# Patient Record
Sex: Female | Born: 1950 | Race: White | Hispanic: No | Smoking: Never smoker
Health system: Southern US, Community
[De-identification: ages and names within clinical notes are randomized; demographics above are authoritative.]

## PROBLEM LIST (undated history)

## (undated) DIAGNOSIS — Z881 Allergy status to other antibiotic agents status: Secondary | ICD-10-CM

## (undated) DIAGNOSIS — Z889 Allergy status to unspecified drugs, medicaments and biological substances status: Secondary | ICD-10-CM

## (undated) DIAGNOSIS — S15009A Unspecified injury of unspecified carotid artery, initial encounter: Secondary | ICD-10-CM

## (undated) DIAGNOSIS — S0990XA Unspecified injury of head, initial encounter: Secondary | ICD-10-CM

## (undated) DIAGNOSIS — E119 Type 2 diabetes mellitus without complications: Secondary | ICD-10-CM

## (undated) DIAGNOSIS — C801 Malignant (primary) neoplasm, unspecified: Secondary | ICD-10-CM

## (undated) DIAGNOSIS — T07XXXA Unspecified multiple injuries, initial encounter: Secondary | ICD-10-CM

## (undated) DIAGNOSIS — E785 Hyperlipidemia, unspecified: Secondary | ICD-10-CM

## (undated) DIAGNOSIS — Z8672 Personal history of thrombophlebitis: Secondary | ICD-10-CM

## (undated) DIAGNOSIS — G40409 Other generalized epilepsy and epileptic syndromes, not intractable, without status epilepticus: Secondary | ICD-10-CM

## (undated) DIAGNOSIS — F32A Depression, unspecified: Secondary | ICD-10-CM

## (undated) DIAGNOSIS — F329 Major depressive disorder, single episode, unspecified: Secondary | ICD-10-CM

## (undated) DIAGNOSIS — R4182 Altered mental status, unspecified: Secondary | ICD-10-CM

## (undated) DIAGNOSIS — E039 Hypothyroidism, unspecified: Secondary | ICD-10-CM

## (undated) DIAGNOSIS — L309 Dermatitis, unspecified: Secondary | ICD-10-CM

## (undated) DIAGNOSIS — E28319 Asymptomatic premature menopause: Secondary | ICD-10-CM

## (undated) DIAGNOSIS — F419 Anxiety disorder, unspecified: Secondary | ICD-10-CM

## (undated) HISTORY — DX: Unspecified multiple injuries, initial encounter: T07.XXXA

## (undated) HISTORY — DX: Unspecified injury of unspecified carotid artery, initial encounter: S15.009A

## (undated) HISTORY — DX: Allergy status to unspecified drugs, medicaments and biological substances: Z88.9

## (undated) HISTORY — DX: Asymptomatic premature menopause: E28.319

## (undated) HISTORY — DX: Unspecified injury of head, initial encounter: S09.90XA

## (undated) HISTORY — DX: Anxiety disorder, unspecified: F41.9

## (undated) HISTORY — DX: Depression, unspecified: F32.A

## (undated) HISTORY — DX: Other generalized epilepsy and epileptic syndromes, not intractable, without status epilepticus: G40.409

## (undated) HISTORY — DX: Dermatitis, unspecified: L30.9

## (undated) HISTORY — DX: Hyperlipidemia, unspecified: E78.5

## (undated) HISTORY — DX: Type 2 diabetes mellitus without complications: E11.9

## (undated) HISTORY — DX: Personal history of thrombophlebitis: Z86.72

## (undated) HISTORY — DX: Allergy status to other antibiotic agents: Z88.1

## (undated) HISTORY — DX: Altered mental status, unspecified: R41.82

## (undated) HISTORY — DX: Malignant (primary) neoplasm, unspecified: C80.1

## (undated) HISTORY — DX: Major depressive disorder, single episode, unspecified: F32.9

## (undated) HISTORY — DX: Hypothyroidism, unspecified: E03.9

---

## 1992-04-12 HISTORY — PX: BARTHOLIN GLAND CYST EXCISION: SHX565

## 1998-03-26 ENCOUNTER — Other Ambulatory Visit: Admission: RE | Admit: 1998-03-26 | Discharge: 1998-03-26 | Payer: Self-pay | Admitting: Gynecology

## 1999-08-05 ENCOUNTER — Other Ambulatory Visit: Admission: RE | Admit: 1999-08-05 | Discharge: 1999-08-05 | Payer: Self-pay | Admitting: Gynecology

## 1999-08-07 ENCOUNTER — Encounter: Admission: RE | Admit: 1999-08-07 | Discharge: 1999-08-07 | Payer: Self-pay | Admitting: Gynecology

## 1999-08-07 ENCOUNTER — Encounter: Payer: Self-pay | Admitting: Gynecology

## 1999-12-18 ENCOUNTER — Other Ambulatory Visit: Admission: RE | Admit: 1999-12-18 | Discharge: 1999-12-18 | Payer: Self-pay | Admitting: Gynecology

## 2000-01-07 ENCOUNTER — Other Ambulatory Visit: Admission: RE | Admit: 2000-01-07 | Discharge: 2000-01-07 | Payer: Self-pay | Admitting: Gynecology

## 2000-07-07 ENCOUNTER — Encounter: Payer: Self-pay | Admitting: Gynecology

## 2000-07-07 ENCOUNTER — Encounter: Admission: RE | Admit: 2000-07-07 | Discharge: 2000-07-07 | Payer: Self-pay | Admitting: Gynecology

## 2000-10-27 ENCOUNTER — Other Ambulatory Visit: Admission: RE | Admit: 2000-10-27 | Discharge: 2000-10-27 | Payer: Self-pay | Admitting: Gynecology

## 2001-04-09 ENCOUNTER — Emergency Department (HOSPITAL_COMMUNITY): Admission: EM | Admit: 2001-04-09 | Discharge: 2001-04-09 | Payer: Self-pay | Admitting: Emergency Medicine

## 2001-04-09 ENCOUNTER — Encounter: Payer: Self-pay | Admitting: Emergency Medicine

## 2001-11-09 ENCOUNTER — Other Ambulatory Visit: Admission: RE | Admit: 2001-11-09 | Discharge: 2001-11-09 | Payer: Self-pay | Admitting: Gynecology

## 2001-11-28 ENCOUNTER — Encounter: Payer: Self-pay | Admitting: Gynecology

## 2001-11-28 ENCOUNTER — Encounter: Admission: RE | Admit: 2001-11-28 | Discharge: 2001-11-28 | Payer: Self-pay | Admitting: Gynecology

## 2003-03-20 ENCOUNTER — Other Ambulatory Visit: Admission: RE | Admit: 2003-03-20 | Discharge: 2003-03-20 | Payer: Self-pay | Admitting: Gynecology

## 2003-04-17 ENCOUNTER — Ambulatory Visit (HOSPITAL_COMMUNITY): Admission: RE | Admit: 2003-04-17 | Discharge: 2003-04-17 | Payer: Self-pay | Admitting: Gynecology

## 2005-04-14 ENCOUNTER — Other Ambulatory Visit: Admission: RE | Admit: 2005-04-14 | Discharge: 2005-04-14 | Payer: Self-pay | Admitting: Gynecology

## 2005-04-22 ENCOUNTER — Ambulatory Visit (HOSPITAL_COMMUNITY): Admission: RE | Admit: 2005-04-22 | Discharge: 2005-04-22 | Payer: Self-pay | Admitting: Family Medicine

## 2005-04-23 ENCOUNTER — Encounter: Admission: RE | Admit: 2005-04-23 | Discharge: 2005-04-23 | Payer: Self-pay | Admitting: Gynecology

## 2005-07-27 ENCOUNTER — Encounter: Admission: RE | Admit: 2005-07-27 | Discharge: 2005-07-27 | Payer: Self-pay | Admitting: General Surgery

## 2005-12-15 ENCOUNTER — Ambulatory Visit (HOSPITAL_COMMUNITY): Admission: RE | Admit: 2005-12-15 | Discharge: 2005-12-15 | Payer: Self-pay | Admitting: Family Medicine

## 2006-05-18 ENCOUNTER — Encounter: Admission: RE | Admit: 2006-05-18 | Discharge: 2006-05-18 | Payer: Self-pay | Admitting: General Surgery

## 2006-05-23 ENCOUNTER — Other Ambulatory Visit: Admission: RE | Admit: 2006-05-23 | Discharge: 2006-05-23 | Payer: Self-pay | Admitting: Gynecology

## 2010-09-18 ENCOUNTER — Encounter: Payer: Self-pay | Admitting: Nurse Practitioner

## 2010-11-18 LAB — TSH: TSH: 0.24 u[IU]/mL — AB (ref <=5.90)

## 2011-02-16 ENCOUNTER — Other Ambulatory Visit (HOSPITAL_COMMUNITY): Payer: Self-pay | Admitting: Family Medicine

## 2011-02-16 DIAGNOSIS — Z139 Encounter for screening, unspecified: Secondary | ICD-10-CM

## 2011-02-23 ENCOUNTER — Ambulatory Visit (HOSPITAL_COMMUNITY)
Admission: RE | Admit: 2011-02-23 | Discharge: 2011-02-23 | Disposition: A | Discharge status: Home / Self Care | Payer: PRIVATE HEALTH INSURANCE | Source: Ambulatory Visit | Attending: Family Medicine | Admitting: Family Medicine

## 2011-02-23 DIAGNOSIS — Z139 Encounter for screening, unspecified: Secondary | ICD-10-CM

## 2011-02-23 DIAGNOSIS — Z1231 Encounter for screening mammogram for malignant neoplasm of breast: Secondary | ICD-10-CM | POA: Insufficient documentation

## 2012-07-07 ENCOUNTER — Ambulatory Visit: Payer: Self-pay | Admitting: Nurse Practitioner

## 2012-07-31 ENCOUNTER — Telehealth: Payer: Self-pay | Admitting: Nurse Practitioner

## 2012-08-01 NOTE — Telephone Encounter (Signed)
PT WILL CALL BACK FOR APPT- COULDN'T DECIDE WHEN SHE WANTS TO COME.

## 2012-08-28 ENCOUNTER — Other Ambulatory Visit: Payer: Self-pay | Admitting: Obstetrics and Gynecology

## 2012-08-28 DIAGNOSIS — Z1231 Encounter for screening mammogram for malignant neoplasm of breast: Secondary | ICD-10-CM

## 2012-08-29 ENCOUNTER — Ambulatory Visit (HOSPITAL_COMMUNITY): Payer: Self-pay | Attending: Obstetrics and Gynecology

## 2012-08-29 ENCOUNTER — Ambulatory Visit (HOSPITAL_COMMUNITY): Payer: PRIVATE HEALTH INSURANCE

## 2012-11-07 ENCOUNTER — Telehealth: Payer: Self-pay | Admitting: Nurse Practitioner

## 2012-11-07 NOTE — Telephone Encounter (Signed)
appt changed

## 2012-11-09 ENCOUNTER — Other Ambulatory Visit (HOSPITAL_COMMUNITY): Payer: Self-pay | Admitting: Nurse Practitioner

## 2012-11-09 DIAGNOSIS — Z139 Encounter for screening, unspecified: Secondary | ICD-10-CM

## 2012-11-13 ENCOUNTER — Ambulatory Visit (HOSPITAL_COMMUNITY)
Admission: RE | Admit: 2012-11-13 | Discharge: 2012-11-13 | Disposition: A | Discharge status: Home / Self Care | Payer: Self-pay | Source: Ambulatory Visit | Attending: Nurse Practitioner | Admitting: Nurse Practitioner

## 2012-11-13 ENCOUNTER — Ambulatory Visit (HOSPITAL_COMMUNITY): Payer: PRIVATE HEALTH INSURANCE

## 2012-11-13 DIAGNOSIS — Z1231 Encounter for screening mammogram for malignant neoplasm of breast: Secondary | ICD-10-CM | POA: Insufficient documentation

## 2012-11-13 DIAGNOSIS — Z139 Encounter for screening, unspecified: Secondary | ICD-10-CM

## 2012-11-29 ENCOUNTER — Encounter: Payer: Self-pay | Admitting: *Deleted

## 2012-12-08 ENCOUNTER — Ambulatory Visit (INDEPENDENT_AMBULATORY_CARE_PROVIDER_SITE_OTHER): Payer: Self-pay | Admitting: Nurse Practitioner

## 2012-12-08 ENCOUNTER — Ambulatory Visit: Payer: Self-pay | Admitting: General Practice

## 2012-12-08 ENCOUNTER — Ambulatory Visit: Payer: Self-pay | Admitting: Nurse Practitioner

## 2012-12-08 ENCOUNTER — Encounter: Payer: Self-pay | Admitting: Nurse Practitioner

## 2012-12-08 VITALS — BP 128/84 | HR 108 | Temp 97.5°F | Ht 66.0 in | Wt 161.0 lb

## 2012-12-08 DIAGNOSIS — R609 Edema, unspecified: Secondary | ICD-10-CM

## 2012-12-08 DIAGNOSIS — R569 Unspecified convulsions: Secondary | ICD-10-CM | POA: Insufficient documentation

## 2012-12-08 DIAGNOSIS — F411 Generalized anxiety disorder: Secondary | ICD-10-CM | POA: Insufficient documentation

## 2012-12-08 DIAGNOSIS — E039 Hypothyroidism, unspecified: Secondary | ICD-10-CM

## 2012-12-08 MED ORDER — LEVOTHYROXINE SODIUM 50 MCG PO TABS: 50.0000 ug | ORAL_TABLET | Freq: Every day | ORAL | Status: DC

## 2012-12-08 MED ORDER — FUROSEMIDE 20 MG PO TABS: 20.0000 mg | ORAL_TABLET | Freq: Every day | ORAL | Status: DC

## 2012-12-08 MED ORDER — ALPRAZOLAM 2 MG PO TABS: 2.0000 mg | ORAL_TABLET | Freq: Every evening | ORAL | Status: DC | PRN

## 2012-12-08 NOTE — Patient Instructions (Addendum)
Edema  Edema is an abnormal build-up of fluids in tissues. Because this is partly dependent on gravity (water flows to the lowest place), it is more common in the legs and thighs (lower extremities). It is also common in the looser tissues, like around the eyes. Painless swelling of the feet and ankles is common and increases as a person ages. It may affect both legs and may include the calves or even thighs. When squeezed, the fluid may move out of the affected area and may leave a dent for a few moments.  CAUSES    Prolonged standing or sitting in one place for extended periods of time. Movement helps pump tissue fluid into the veins, and absence of movement prevents this, resulting in edema.   Varicose veins. The valves in the veins do not work as well as they should. This causes fluid to leak into the tissues.   Fluid and salt overload.   Injury, burn, or surgery to the leg, ankle, or foot, may damage veins and allow fluid to leak out.   Sunburn damages vessels. Leaky vessels allow fluid to go out into the sunburned tissues.   Allergies (from insect bites or stings, medications or chemicals) cause swelling by allowing vessels to become leaky.   Protein in the blood helps keep fluid in your vessels. Low protein, as in malnutrition, allows fluid to leak out.   Hormonal changes, including pregnancy and menstruation, cause fluid retention. This fluid may leak out of vessels and cause edema.   Medications that cause fluid retention. Examples are sex hormones, blood pressure medications, steroid treatment, or anti-depressants.   Some illnesses cause edema, especially heart failure, kidney disease, or liver disease.   Surgery that cuts veins or lymph nodes, such as surgery done for the heart or for breast cancer, may result in edema.  DIAGNOSIS   Your caregiver is usually easily able to determine what is causing your swelling (edema) by simply asking what is wrong (getting a history) and examining you (doing  a physical). Sometimes x-rays, EKG (electrocardiogram or heart tracing), and blood work may be done to evaluate for underlying medical illness.  TREATMENT   General treatment includes:   Leg elevation (or elevation of the affected body part).   Restriction of fluid intake.   Prevention of fluid overload.   Compression of the affected body part. Compression with elastic bandages or support stockings squeezes the tissues, preventing fluid from entering and forcing it back into the blood vessels.   Diuretics (also called water pills or fluid pills) pull fluid out of your body in the form of increased urination. These are effective in reducing the swelling, but can have side effects and must be used only under your caregiver's supervision. Diuretics are appropriate only for some types of edema.  The specific treatment can be directed at any underlying causes discovered. Heart, liver, or kidney disease should be treated appropriately.  HOME CARE INSTRUCTIONS    Elevate the legs (or affected body part) above the level of the heart, while lying down.   Avoid sitting or standing still for prolonged periods of time.   Avoid putting anything directly under the knees when lying down, and do not wear constricting clothing or garters on the upper legs.   Exercising the legs causes the fluid to work back into the veins and lymphatic channels. This may help the swelling go down.   The pressure applied by elastic bandages or support stockings can help reduce ankle swelling.     A low-salt diet may help reduce fluid retention and decrease the ankle swelling.   Take any medications exactly as prescribed.  SEEK MEDICAL CARE IF:   Your edema is not responding to recommended treatments.  SEEK IMMEDIATE MEDICAL CARE IF:    You develop shortness of breath or chest pain.   You cannot breathe when you lay down; or if, while lying down, you have to get up and go to the window to get your breath.   You are having increasing  swelling without relief from treatment.   You develop a fever over 102 F (38.9 C).   You develop pain or redness in the areas that are swollen.   Tell your caregiver right away if you have gained 3 lb/1.4 kg in 1 day or 5 lb/2.3 kg in a week.  MAKE SURE YOU:    Understand these instructions.   Will watch your condition.   Will get help right away if you are not doing well or get worse.  Document Released: 03/29/2005 Document Revised: 09/28/2011 Document Reviewed: 11/15/2007  Exit[employer] Patient Information 2014 [employer].

## 2012-12-08 NOTE — Addendum Note (Signed)
Addended by: Bennie Pierini on: 12/08/2012 05:01 PM   Modules accepted: Orders

## 2012-12-08 NOTE — Progress Notes (Signed)
  Subjective:    Patient ID: Heather Leblanc, female    DOB: January 29, 1951, 62 y.o.   MRN: [id number]  Thyroid Problem Presents for follow-up (hypothyroidism) visit. Patient reports no anxiety, constipation, depressed mood, diaphoresis, diarrhea, dry skin, heat intolerance, hoarse voice, menstrual problem, palpitations, tremors or visual change.   GAD Xanax 2mg  BID- can't make it without it- Dr. Elana Alm bumped her up to 2 mg several years ago.  Review of Systems  Constitutional: Positive for unexpected weight change (gained). Negative for diaphoresis.  HENT: Negative for hoarse voice.   Cardiovascular: Negative for palpitations.  Gastrointestinal: Negative for diarrhea and constipation.  Endocrine: Negative for heat intolerance.  Genitourinary: Negative for menstrual problem.  Neurological: Negative for tremors.       Objective:   Physical Exam  Constitutional: She is oriented to person, place, and time. She appears well-developed and well-nourished.  HENT:  Nose: Nose normal.  Mouth/Throat: Oropharynx is clear and moist.  Eyes: EOM are normal.  Neck: Trachea normal, normal range of motion and full passive range of motion without pain. Neck supple. No JVD present. Carotid bruit is not present. No thyromegaly present.  Cardiovascular: Normal rate, regular rhythm, normal heart sounds and intact distal pulses.  Exam reveals no gallop and no friction rub.   No murmur heard. Pulmonary/Chest: Effort normal and breath sounds normal.  Abdominal: Soft. Bowel sounds are normal. She exhibits no distension and no mass. There is no tenderness.  Musculoskeletal: Normal range of motion. She exhibits edema (2+ right lower ext and 1+ left lower ext).  Lymphadenopathy:    She has no cervical adenopathy.  Neurological: She is alert and oriented to person, place, and time. She has normal reflexes.  Skin: Skin is warm and dry.  Psychiatric: She has a normal mood and affect. Her behavior is normal. Judgment  and thought content normal.   BP 128/84  Pulse 108  Temp(Src) 97.5 F (36.4 C) (Oral)  Ht 5\' 6"  (1.676 m)  Wt 161 lb (73.029 kg)  BMI 26 kg/m2        Assessment & Plan:  1. Hypothyroidism Continue current meds  2. Seizures No seizure in years  3. GAD (generalized anxiety disorder) Stress management  4. Peripheral edema Elevate legs when sitting - furosemide (LASIX) 20 MG tablet; Take 1 tablet (20 mg total) by mouth daily.  Dispense: 30 tablet; Refill: 3  Patient said going [facility]  Shaka-Margaret Daphine Deutscher, FNP

## 2012-12-12 ENCOUNTER — Ambulatory Visit: Payer: Self-pay | Admitting: Nurse Practitioner

## 2013-02-06 ENCOUNTER — Telehealth: Payer: Self-pay | Admitting: Nurse Practitioner

## 2013-02-06 ENCOUNTER — Other Ambulatory Visit: Payer: Self-pay | Admitting: Nurse Practitioner

## 2013-02-08 NOTE — Telephone Encounter (Signed)
Last seen 12/08/12  DWM  If approved route to nurse to phone into [facility]

## 2013-02-08 NOTE — Telephone Encounter (Signed)
CALLED IN 

## 2013-02-08 NOTE — Telephone Encounter (Signed)
Please call in xanax rx 

## 2013-03-05 ENCOUNTER — Other Ambulatory Visit: Payer: Self-pay | Admitting: Nurse Practitioner

## 2013-03-06 ENCOUNTER — Other Ambulatory Visit: Payer: Self-pay

## 2013-03-06 NOTE — Telephone Encounter (Signed)
Please call in xanax

## 2013-03-06 NOTE — Telephone Encounter (Signed)
Last seen 12/08/12  MMM  If approved route to nurse to call into [facility] 

## 2013-03-07 NOTE — Telephone Encounter (Signed)
Called into [facility] 

## 2013-03-09 ENCOUNTER — Other Ambulatory Visit: Payer: Self-pay | Admitting: Nurse Practitioner

## 2013-04-02 ENCOUNTER — Other Ambulatory Visit: Payer: Self-pay | Admitting: Nurse Practitioner

## 2013-04-03 NOTE — Telephone Encounter (Signed)
rx called into pharmacy

## 2013-04-03 NOTE — Telephone Encounter (Signed)
Last seen 12/08/12  MMM  If approved route to nurse to call into [facility]

## 2013-04-03 NOTE — Telephone Encounter (Signed)
Please call in xanax

## 2013-04-24 ENCOUNTER — Other Ambulatory Visit: Payer: Self-pay | Admitting: Nurse Practitioner

## 2013-04-27 ENCOUNTER — Other Ambulatory Visit: Payer: Self-pay | Admitting: *Deleted

## 2013-04-27 MED ORDER — ALPRAZOLAM 2 MG PO TABS: ORAL_TABLET | ORAL | Status: DC

## 2013-04-27 NOTE — Telephone Encounter (Signed)
rx called into the pharmacy  

## 2013-04-27 NOTE — Telephone Encounter (Signed)
Please call in xanax rx with note to do not fill until 05/07/13

## 2013-04-27 NOTE — Telephone Encounter (Signed)
Patient last seen in office on 8-29. Rx last filled on 12-22. Fax from pharmacy states that they will not fill until 1-26. Please advise. Please advise and if approved route to Pool B so nurse can phone in to [facility]

## 2013-06-11 ENCOUNTER — Ambulatory Visit: Payer: Self-pay | Admitting: Nurse Practitioner

## 2013-06-11 ENCOUNTER — Encounter: Payer: Self-pay | Admitting: Family Medicine

## 2013-06-11 VITALS — BP 131/83 | HR 110 | Temp 98.4°F | Ht 66.0 in | Wt 177.4 lb

## 2013-06-11 DIAGNOSIS — F411 Generalized anxiety disorder: Secondary | ICD-10-CM

## 2013-06-11 MED ORDER — ALPRAZOLAM 2 MG PO TABS: ORAL_TABLET | ORAL | Status: DC

## 2013-06-11 NOTE — Progress Notes (Signed)
   Subjective:    Patient ID: Murlean Caller, female    DOB: 26-Dec-1950, 63 y.o.   MRN: [id number]  HPI  This 63 y.o. female presents for evaluation of needing med refill.  Review of Systems    No chest pain, SOB, HA, dizziness, vision change, N/V, diarrhea, constipation, dysuria, urinary urgency or frequency, myalgias, arthralgias or rash.  Objective:   Physical Exam  Vital signs noted  Well developed well nourished female.  HEENT - Head atraumatic Normocephalic                Eyes - PERRLA, Conjuctiva - clear Sclera- Clear EOMI                Ears - EAC's Wnl TM's Wnl Gross Hearing WNL                Nose - Nares patent                 Throat - oropharanx wnl Respiratory - Lungs CTA bilateral Cardiac - RRR S1 and S2 without murmur GI - Abdomen soft Nontender and bowel sounds active x 4 Extremities - No edema. Neuro - Grossly intact.      Assessment & Plan:

## 2013-06-11 NOTE — Progress Notes (Signed)
   Subjective:    Patient ID: Heather Leblanc, female    DOB: 04/13/1950, 63 y.o.   MRN: [id number]  HPI  Patient in today for med refill- SHe is on xanax for GAD- she takes 2mg  BID. SHe has been oon it for awhile- [facility]. SHe says that she cannot function without meds. Had [facility],2015 [facility]. SH eis doing well without complaints.    Review of Systems  Constitutional: Negative.   HENT: Negative.   Respiratory: Negative.   Cardiovascular: Negative.   Psychiatric/Behavioral: Negative.   All other systems reviewed and are negative.       Objective:   Physical Exam  Constitutional: She is oriented to person, place, and time. She appears well-nourished.  Cardiovascular: Normal rate, regular rhythm and normal heart sounds.   Pulmonary/Chest: Effort normal and breath sounds normal.  Neurological: She is alert and oriented to person, place, and time.  Skin: Skin is warm.  Psychiatric: She has a normal mood and affect. Her behavior is normal. Judgment and thought content normal.   BP 131/83  Pulse 110  Temp(Src) 98.4 F (36.9 C) (Oral)  Ht 5\' 6"  (1.676 m)  Wt 177 lb 6.4 oz (80.468 kg)  BMI 28.65 kg/m2        Assessment & Plan:   1. GAD (generalized anxiety disorder)    Meds ordered this encounter  Medications  . levothyroxine (SYNTHROID, LEVOTHROID) 88 MCG tablet    Sig: Take 88 mcg by mouth daily before breakfast.  . alprazolam (XANAX) 2 MG tablet    Sig: TAKE  (1)  TABLET TWICE A DAY.    Dispense:  60 tablet    Refill:  0    Order Specific Question:  Supervising Provider    Answer:  Chipper Herb [[id number]]  discussed xanax and need to decrease dose at next visit. Recommended counseling Stress management Follow up in 3 months  Shaylee-Margaret Hassell Done, FNP

## 2013-06-11 NOTE — Patient Instructions (Signed)
Stress Management Stress is a state of physical or mental tension that often results from changes in your life or normal routine. Some common causes of stress are:  Death of a loved one.  Injuries or severe illnesses.  Getting fired or changing jobs.  Moving into a new home. Other causes may be:  Sexual problems.  Business or financial losses.  Taking on a large debt.  Regular conflict with someone at home or at work.  Constant tiredness from lack of sleep. It is not just bad things that are stressful. It may be stressful to:  Win the lottery.  Get married.  Buy a new car. The amount of stress that can be easily tolerated varies from person to person. Changes generally cause stress, regardless of the types of change. Too much stress can affect your health. It may lead to physical or emotional problems. Too little stress (boredom) may also become stressful. SUGGESTIONS TO REDUCE STRESS:  Talk things over with your family and friends. It often is helpful to share your concerns and worries. If you feel your problem is serious, you may want to get help from a professional counselor.  Consider your problems one at a time instead of lumping them all together. Trying to take care of everything at once may seem impossible. List all the things you need to do and then start with the most important one. Set a goal to accomplish 2 or 3 things each day. If you expect to do too many in a single day you will naturally fail, causing you to feel even more stressed.  Do not use alcohol or drugs to relieve stress. Although you may feel better for a short time, they do not remove the problems that caused the stress. They can also be habit forming.  Exercise regularly - at least 3 times per week. Physical exercise can help to relieve that "uptight" feeling and will relax you.  The shortest distance between despair and hope is often a good night's sleep.  Go to bed and get up on time allowing  yourself time for appointments without being rushed.  Take a short "time-out" period from any stressful situation that occurs during the day. Close your eyes and take some deep breaths. Starting with the muscles in your face, tense them, hold it for a few seconds, then relax. Repeat this with the muscles in your neck, shoulders, hand, stomach, back and legs.  Take good care of yourself. Eat a balanced diet and get plenty of rest.  Schedule time for having fun. Take a break from your daily routine to relax. HOME CARE INSTRUCTIONS   Call if you feel overwhelmed by your problems and feel you can no longer manage them on your own.  Return immediately if you feel like hurting yourself or someone else. Document Released: 09/22/2000 Document Revised: 06/21/2011 Document Reviewed: 11/21/2012 Exit[employer] Patient Information 2014 [employer].  

## 2013-07-06 ENCOUNTER — Telehealth: Payer: Self-pay | Admitting: Nurse Practitioner

## 2013-07-09 MED ORDER — ALPRAZOLAM 2 MG PO TABS: ORAL_TABLET | ORAL | Status: DC

## 2013-07-09 NOTE — Telephone Encounter (Signed)
Xanax 2 mg 1 po BID #60 1 refill- needs to work on cutting down to 1/2 tablet BID by next refill.

## 2013-07-09 NOTE — Telephone Encounter (Signed)
Called in and pt aware to cut back.

## 2013-08-06 ENCOUNTER — Telehealth: Payer: Self-pay | Admitting: Nurse Practitioner

## 2013-08-06 MED ORDER — ALPRAZOLAM 2 MG PO TABS: ORAL_TABLET | ORAL | Status: DC

## 2013-08-06 NOTE — Telephone Encounter (Signed)
Called in.

## 2013-08-06 NOTE — Telephone Encounter (Signed)
Xanax 2mg  1- 1 1/2 Po qd #40 0 refills- [employer]

## 2013-08-13 ENCOUNTER — Encounter: Payer: Self-pay | Admitting: *Deleted

## 2013-09-06 ENCOUNTER — Other Ambulatory Visit: Payer: Self-pay | Admitting: Nurse Practitioner

## 2013-09-07 NOTE — Telephone Encounter (Signed)
Last seen 06/11/13 MMM  IF approved route to  Nurse to call into [facility]

## 2013-09-10 ENCOUNTER — Other Ambulatory Visit: Payer: Self-pay | Admitting: *Deleted

## 2013-09-10 NOTE — Telephone Encounter (Signed)
Called in.

## 2013-09-10 NOTE — Telephone Encounter (Signed)
Please call in xanax with 1 refills 

## 2013-10-05 ENCOUNTER — Other Ambulatory Visit: Payer: Self-pay | Admitting: Nurse Practitioner

## 2013-10-08 NOTE — Telephone Encounter (Signed)
Called in.

## 2013-10-08 NOTE — Telephone Encounter (Signed)
Patient NTBS for follow up and lab work Please call in xanax with 1 refills  

## 2013-10-08 NOTE — Telephone Encounter (Signed)
Patient last seen in office on 06-11-12. Rx last filled on 09-10-13 for #40. Please advise. If approved please route to pool B so nurse can phone in to pharmacy

## 2013-11-03 ENCOUNTER — Other Ambulatory Visit: Payer: Self-pay | Admitting: Nurse Practitioner

## 2013-11-05 NOTE — Telephone Encounter (Signed)
Last seen 06/11/13  MMM  If approved route to nurse to call into [facility]

## 2013-11-05 NOTE — Telephone Encounter (Signed)
Patient NTBS for follow up and lab work Please call in xanax with 1 refills  

## 2013-11-06 NOTE — Telephone Encounter (Signed)
Called in.

## 2013-12-03 ENCOUNTER — Ambulatory Visit: Payer: Self-pay | Admitting: Nurse Practitioner

## 2013-12-03 ENCOUNTER — Encounter: Payer: Self-pay | Admitting: Nurse Practitioner

## 2013-12-03 VITALS — BP 152/89 | HR 104 | Temp 98.1°F | Ht 66.0 in | Wt 188.0 lb

## 2013-12-03 DIAGNOSIS — R609 Edema, unspecified: Secondary | ICD-10-CM

## 2013-12-03 DIAGNOSIS — E0789 Other specified disorders of thyroid: Secondary | ICD-10-CM

## 2013-12-03 DIAGNOSIS — E038 Other specified hypothyroidism: Secondary | ICD-10-CM

## 2013-12-03 DIAGNOSIS — R030 Elevated blood-pressure reading, without diagnosis of hypertension: Secondary | ICD-10-CM | POA: Insufficient documentation

## 2013-12-03 DIAGNOSIS — Z713 Dietary counseling and surveillance: Secondary | ICD-10-CM

## 2013-12-03 DIAGNOSIS — Z6830 Body mass index (BMI) 30.0-30.9, adult: Secondary | ICD-10-CM

## 2013-12-03 DIAGNOSIS — E034 Atrophy of thyroid (acquired): Secondary | ICD-10-CM

## 2013-12-03 DIAGNOSIS — F411 Generalized anxiety disorder: Secondary | ICD-10-CM

## 2013-12-03 MED ORDER — LEVOTHYROXINE SODIUM 88 MCG PO TABS: 88.0000 ug | ORAL_TABLET | Freq: Every day | ORAL | Status: DC

## 2013-12-03 MED ORDER — CITALOPRAM HYDROBROMIDE 20 MG PO TABS: 20.0000 mg | ORAL_TABLET | Freq: Every day | ORAL | Status: DC

## 2013-12-03 MED ORDER — ALPRAZOLAM 1 MG PO TABS: 1.0000 mg | ORAL_TABLET | Freq: Every evening | ORAL | Status: DC | PRN

## 2013-12-03 MED ORDER — FUROSEMIDE 20 MG PO TABS: 20.0000 mg | ORAL_TABLET | Freq: Every day | ORAL | Status: DC

## 2013-12-03 NOTE — Progress Notes (Signed)
Subjective:    Patient ID: Heather Leblanc, female    DOB: [birth year], 63 y.o.   MRN: [id number]  HPI Patient in today for follow up of anxiety- when seen at last visit she was told that she needed to decrease her xanax dose because she is on 2 mg BID. Patient says that she is trying to cut back on the amount that she takes. She is currently on 2mg   And says that she breaks them in 1/2 and spaces them out through the day. Sh ehas never tried anything else for her anxiety. - hypothyroidism- currently on synthroid 34mcg daily- no c/o fatigue. (HAD LABS DONE AT [facility] LAST WEEK) - peripheral edema- takes lasix 3-4 x a week as needed.  Review of Systems  Constitutional: Negative for appetite change, fatigue and unexpected weight change.  HENT: Negative.   Respiratory: Negative.   Cardiovascular: Negative.   Genitourinary: Negative.   Neurological: Negative.   Psychiatric/Behavioral: The patient is nervous/anxious.   All other systems reviewed and are negative.      Objective:   Physical Exam  Constitutional: She is oriented to person, place, and time. She appears well-developed and well-nourished.  Cardiovascular: Normal rate, regular rhythm and normal heart sounds.   Pulmonary/Chest: Effort normal and breath sounds normal.  Neurological: She is alert and oriented to person, place, and time.  Skin: Skin is warm and dry.  Psychiatric: She has a normal mood and affect. Her behavior is normal. Judgment and thought content normal.  Very talkative    BP 183/101  Pulse 104  Temp(Src) 98.1 F (36.7 C) (Oral)  Ht 5\' 6"  (1.676 m)  Wt 188 lb (85.276 kg)  BMI 30.36 kg/m2       Assessment & Plan:   1. Peripheral edema   2. GAD (generalized anxiety disorder)   3. Hypothyroidism due to acquired atrophy of thyroid   4. BMI 30.0-30.9,adult   5. Weight loss counseling, encounter for   6. White coat syndrome without hypertension      Meds ordered this encounter  Medications  .  ALPRAZolam (XANAX) 1 MG tablet    Sig: Take 1 tablet (1 mg total) by mouth at bedtime as needed for anxiety.    Dispense:  90 tablet    Refill:  1    Order Specific Question:  Supervising Provider    Answer:  Chipper Herb [[id number]]  . levothyroxine (SYNTHROID, LEVOTHROID) 88 MCG tablet    Sig: Take 1 tablet (88 mcg total) by mouth daily before breakfast.    Dispense:  30 tablet    Refill:  5    Order Specific Question:  Supervising Provider    Answer:  Chipper Herb [[id number]]  . furosemide (LASIX) 20 MG tablet    Sig: Take 1 tablet (20 mg total) by mouth daily.    Dispense:  30 tablet    Refill:  3    Order Specific Question:  Supervising Provider    Answer:  Chipper Herb [[id number]]  . citalopram (CELEXA) 20 MG tablet    Sig: Take 1 tablet (20 mg total) by mouth daily.    Dispense:  30 tablet    Refill:  5    Order Specific Question:  Supervising Provider    Answer:  Chipper Herb [[id number]]   Patient checks blood pressure at home and it stays inn the 295'J systolic-  Labs pending Health maintenance reviewed Diet and exercise encouraged Continue all meds Follow up  In 6 months   [place], FNP

## 2013-12-03 NOTE — Patient Instructions (Signed)
Stress and Stress Management Stress is a normal reaction to life events. It is what you feel when life demands more than you are used to or more than you can handle. Some stress can be useful. For example, the stress reaction can help you catch the last bus of the day, study for a test, or meet a deadline at work. But stress that occurs too often or for too long can cause problems. It can affect your emotional health and interfere with relationships and normal daily activities. Too much stress can weaken your immune system and increase your risk for physical illness. If you already have a medical problem, stress can make it worse. CAUSES  All sorts of life events may cause stress. An event that causes stress for one person may not be stressful for another person. Major life events commonly cause stress. These may be positive or negative. Examples include losing your job, moving into a new home, getting married, having a baby, or losing a loved one. Less obvious life events may also cause stress, especially if they occur day after day or in combination. Examples include working long hours, driving in traffic, caring for children, being in debt, or being in a difficult relationship. SIGNS AND SYMPTOMS Stress may cause emotional symptoms including, the following:  Anxiety. This is feeling worried, afraid, on edge, overwhelmed, or out of control.  Anger. This is feeling irritated or impatient.  Depression. This is feeling sad, down, helpless, or guilty.  Difficulty focusing, remembering, or making decisions. Stress may cause physical symptoms, including the following:   Aches and pains. These may affect your head, neck, back, stomach, or other areas of your body.  Tight muscles or clenched jaw.  Low energy or trouble sleeping. Stress may cause unhealthy behaviors, including the following:   Eating to feel better (overeating) or skipping meals.  Sleeping too little, too much, or both.  Working  too much or putting off tasks (procrastination).  Smoking, drinking alcohol, or using drugs to feel better. DIAGNOSIS  Stress is diagnosed through an assessment by your health care provider. Your health care provider will ask questions about your symptoms and any stressful life events.Your health care provider will also ask about your medical history and may order blood tests or other tests. Certain medical conditions and medicine can cause physical symptoms similar to stress. Mental illness can cause emotional symptoms and unhealthy behaviors similar to stress. Your health care provider may refer you to a mental health professional for further evaluation.  TREATMENT  Stress management is the recommended treatment for stress.The goals of stress management are reducing stressful life events and coping with stress in healthy ways.  Techniques for reducing stressful life events include the following:  Stress identification. Self-monitor for stress and identify what causes stress for you. These skills may help you to avoid some stressful events.  Time management. Set your priorities, keep a calendar of events, and learn to say "no." These tools can help you avoid making too many commitments. Techniques for coping with stress include the following:  Rethinking the problem. Try to think realistically about stressful events rather than ignoring them or overreacting. Try to find the positives in a stressful situation rather than focusing on the negatives.  Exercise. Physical exercise can release both physical and emotional tension. The key is to find a form of exercise you enjoy and do it regularly.  Relaxation techniques. These relax the body and mind. Examples include yoga, meditation, tai chi, biofeedback, deep  breathing, progressive muscle relaxation, listening to music, being out in nature, journaling, and other hobbies. Again, the key is to find one or more that you enjoy and can do  regularly.  Healthy lifestyle. Eat a balanced diet, get plenty of sleep, and do not smoke. Avoid using alcohol or drugs to relax.  Strong support network. Spend time with family, friends, or other people you enjoy being around.Express your feelings and talk things over with someone you trust. Counseling or talktherapy with a mental health professional may be helpful if you are having difficulty managing stress on your own. Medicine is typically not recommended for the treatment of stress.Talk to your health care provider if you think you need medicine for symptoms of stress. HOME CARE INSTRUCTIONS  Keep all follow-up visits as directed by your health care provider.  Take all medicines as directed by your health care provider. SEEK MEDICAL CARE IF:  Your symptoms get worse or you start having new symptoms.  You feel overwhelmed by your problems and can no longer manage them on your own. SEEK IMMEDIATE MEDICAL CARE IF:  You feel like hurting yourself or someone else. Document Released: 09/22/2000 Document Revised: 08/13/2013 Document Reviewed: 11/21/2012 ExitCare Patient Information 2015 [employer]. This information is not intended to replace advice given to you by your health care provider. Make sure you discuss any questions you have with your health care provider.  

## 2013-12-12 ENCOUNTER — Ambulatory Visit: Payer: Self-pay | Admitting: Nurse Practitioner

## 2013-12-13 ENCOUNTER — Ambulatory Visit: Payer: Self-pay | Admitting: Nurse Practitioner

## 2013-12-21 ENCOUNTER — Telehealth: Payer: Self-pay | Admitting: Family Medicine

## 2013-12-21 NOTE — Telephone Encounter (Signed)
Patient sataes she is having panic attacks on citalpram 20 mg [facility]

## 2014-01-10 ENCOUNTER — Telehealth: Payer: Self-pay | Admitting: Nurse Practitioner

## 2014-01-17 ENCOUNTER — Other Ambulatory Visit: Payer: Self-pay | Admitting: Nurse Practitioner

## 2014-01-17 NOTE — Telephone Encounter (Signed)
Several attempts have been made to contact patient no answer

## 2014-01-30 ENCOUNTER — Telehealth: Payer: Self-pay | Admitting: Nurse Practitioner

## 2014-01-31 ENCOUNTER — Ambulatory Visit: Payer: Self-pay | Admitting: Nurse Practitioner

## 2014-01-31 ENCOUNTER — Encounter: Payer: Self-pay | Admitting: Nurse Practitioner

## 2014-01-31 VITALS — BP 155/95 | HR 111 | Temp 98.8°F | Ht 66.0 in | Wt 163.0 lb

## 2014-01-31 DIAGNOSIS — L732 Hidradenitis suppurativa: Secondary | ICD-10-CM

## 2014-01-31 DIAGNOSIS — N390 Urinary tract infection, site not specified: Secondary | ICD-10-CM

## 2014-01-31 DIAGNOSIS — R319 Hematuria, unspecified: Secondary | ICD-10-CM

## 2014-01-31 LAB — POCT UA - MICROSCOPIC ONLY
CRYSTALS, UR, HPF, POC: NEGATIVE
Casts, Ur, LPF, POC: NEGATIVE
Mucus, UA: NEGATIVE
Yeast, UA: NEGATIVE

## 2014-01-31 LAB — POCT URINALYSIS DIPSTICK
BILIRUBIN UA: NEGATIVE
Glucose, UA: NEGATIVE
Ketones, UA: NEGATIVE
NITRITE UA: POSITIVE
PH UA: 5
Spec Grav, UA: 1.02
Urobilinogen, UA: NEGATIVE

## 2014-01-31 MED ORDER — CEPHALEXIN 500 MG PO CAPS: 500.0000 mg | ORAL_CAPSULE | Freq: Three times a day (TID) | ORAL | Status: DC

## 2014-01-31 NOTE — Telephone Encounter (Signed)
appt scheduled

## 2014-01-31 NOTE — Patient Instructions (Signed)

## 2014-01-31 NOTE — Progress Notes (Signed)
   Subjective:    Patient ID: Heather Leblanc, female    DOB: 11/06/1950, 63 y.o.   MRN: [id number]  HPI Patient is here today for dysuria that started 3 days ago. She reports urinary frequency.   Draining lesion in her right axilla.  A nontender knot on the back of her head. She denies any fall and have not hit her head.    Review of Systems  Genitourinary: Positive for dysuria, urgency and frequency.  All other systems reviewed and are negative.      Objective:   Physical Exam  Constitutional: She is oriented to person, place, and time. She appears well-developed.  HENT:  Head: Normocephalic.  Left nontender knot on the back of the head.   Eyes: Pupils are equal, round, and reactive to light.  Neck: Normal range of motion.  Cardiovascular: Normal rate and regular rhythm.   Pulmonary/Chest: Effort normal and breath sounds normal.  Musculoskeletal: Normal range of motion.  Draining lesion on her right axilla.   Neurological: She is alert and oriented to person, place, and time.  Skin: Skin is warm.  Psychiatric: She has a normal mood and affect.    BP 155/95  Pulse 111  Temp(Src) 98.8 F (37.1 C) (Oral)  Ht 5\' 6"  (1.676 m)  Wt 163 lb (73.936 kg)  BMI 26.32 kg/m2   Results for orders placed in visit on 01/31/14  POCT UA - MICROSCOPIC ONLY      Result Value Ref Range   WBC, Ur, HPF, POC tntc     RBC, urine, microscopic 10-20     Bacteria, U Microscopic many     Mucus, UA negative     Epithelial cells, urine per micros 'few     Crystals, Ur, HPF, POC negative     Casts, Ur, LPF, POC negative     Yeast, UA negative    POCT URINALYSIS DIPSTICK      Result Value Ref Range   Color, UA gold     Clarity, UA sl cloudy     Glucose, UA negative     Bilirubin, UA negative     Ketones, UA negative     Spec Grav, UA 1.020     Blood, UA large     pH, UA 5.0     Protein, UA 2+     Urobilinogen, UA negative     Nitrite, UA positive     Leukocytes, UA large (3+)        Assessment & Plan:   1. Urinary tract infection with hematuria, site unspecified   2. Hydradenitis    Orders Placed This Encounter  Procedures  . Urine culture  . POCT UA - Microscopic Only  . POCT urinalysis dipstick   Meds ordered this encounter  Medications  . cephALEXin (KEFLEX) 500 MG capsule    Sig: Take 1 capsule (500 mg total) by mouth 3 (three) times daily.    Dispense:  21 capsule    Refill:  0    Order Specific Question:  Supervising Provider    Answer:  Chipper Herb [[id number]]    Warm compress to underarm lesion Force fluid Complete entire dose of antibiotic RTO if symptoms persist or worsen   [place]-Margaret Hassell Done, FNP

## 2014-02-02 LAB — URINE CULTURE

## 2014-02-04 ENCOUNTER — Telehealth: Payer: Self-pay | Admitting: *Deleted

## 2014-02-04 NOTE — Telephone Encounter (Signed)
Aware. 

## 2014-02-04 NOTE — Telephone Encounter (Signed)
Message copied by Shelbie Ammons on Mon Feb 04, 2014  4:30 PM ------      Message from: Chevis Pretty      Created: Mon Feb 04, 2014  2:22 PM       Urine culture positive- was given keflex- should take care of it. ------

## 2014-02-26 ENCOUNTER — Other Ambulatory Visit (HOSPITAL_COMMUNITY): Payer: Self-pay | Admitting: *Deleted

## 2014-02-26 DIAGNOSIS — Z1231 Encounter for screening mammogram for malignant neoplasm of breast: Secondary | ICD-10-CM

## 2014-03-01 ENCOUNTER — Ambulatory Visit (HOSPITAL_COMMUNITY)
Admission: RE | Admit: 2014-03-01 | Discharge: 2014-03-01 | Disposition: A | Discharge status: Home / Self Care | Payer: PRIVATE HEALTH INSURANCE | Source: Ambulatory Visit | Attending: *Deleted | Admitting: *Deleted

## 2014-03-01 DIAGNOSIS — Z1231 Encounter for screening mammogram for malignant neoplasm of breast: Secondary | ICD-10-CM

## 2014-03-28 ENCOUNTER — Telehealth: Payer: Self-pay | Admitting: Family Medicine

## 2014-03-29 NOTE — Telephone Encounter (Signed)
Patient scheduled for 1/28 with Ronnald Collum, FNP

## 2014-04-12 HISTORY — PX: OTHER SURGICAL HISTORY: SHX169

## 2014-04-24 ENCOUNTER — Telehealth: Payer: Self-pay | Admitting: Nurse Practitioner

## 2014-04-25 NOTE — Telephone Encounter (Signed)
Patient is requesting a refill on her xanax. Her last refill was on 03/02/14 for #90 directions state that she is to take 1 a day. Please advise. Patient states that she is taking more because of the reaction she had to celexa. Though the reaction to celexa happened in August/September. Patient states that she really needs a refill on this medication or she will not be able to make it through her computer class. I advised patient that I doubt that you would fill it early since it was not taken correctly.

## 2014-04-25 NOTE — Telephone Encounter (Addendum)
Patient aware that Heather Leblanc will not fill any earlier. Patient advised that if she feels like she starts having any withdrawal symptoms to go to ER for evaluation.

## 2014-04-25 NOTE — Telephone Encounter (Signed)
WAY to early for refills was given 90 and should have lasted 3 months- CANNOT just take like want to have to take as rx.

## 2014-05-07 ENCOUNTER — Encounter: Payer: Self-pay | Admitting: Nurse Practitioner

## 2014-05-07 ENCOUNTER — Ambulatory Visit (INDEPENDENT_AMBULATORY_CARE_PROVIDER_SITE_OTHER): Payer: Self-pay | Admitting: Nurse Practitioner

## 2014-05-07 ENCOUNTER — Encounter (INDEPENDENT_AMBULATORY_CARE_PROVIDER_SITE_OTHER): Payer: Self-pay

## 2014-05-07 VITALS — BP 142/84 | HR 100 | Temp 97.8°F | Ht 66.0 in | Wt 165.0 lb

## 2014-05-07 DIAGNOSIS — F411 Generalized anxiety disorder: Secondary | ICD-10-CM

## 2014-05-07 DIAGNOSIS — M7551 Bursitis of right shoulder: Secondary | ICD-10-CM

## 2014-05-07 MED ORDER — IBUPROFEN 600 MG PO TABS: 600.0000 mg | ORAL_TABLET | Freq: Three times a day (TID) | ORAL | Status: DC | PRN

## 2014-05-07 MED ORDER — ESCITALOPRAM OXALATE 20 MG PO TABS: 20.0000 mg | ORAL_TABLET | Freq: Every day | ORAL | Status: DC

## 2014-05-07 MED ORDER — ALPRAZOLAM 1 MG PO TABS
ORAL_TABLET | ORAL | Status: DC
Start: 2014-05-07 — End: 2014-05-30

## 2014-05-07 NOTE — Patient Instructions (Signed)
Stress and Stress Management Stress is a normal reaction to life events. It is what you feel when life demands more than you are used to or more than you can handle. Some stress can be useful. For example, the stress reaction can help you catch the last bus of the day, study for a test, or meet a deadline at work. But stress that occurs too often or for too long can cause problems. It can affect your emotional health and interfere with relationships and normal daily activities. Too much stress can weaken your immune system and increase your risk for physical illness. If you already have a medical problem, stress can make it worse. CAUSES  All sorts of life events may cause stress. An event that causes stress for one person may not be stressful for another person. Major life events commonly cause stress. These may be positive or negative. Examples include losing your job, moving into a new home, getting married, having a baby, or losing a loved one. Less obvious life events may also cause stress, especially if they occur day after day or in combination. Examples include working long hours, driving in traffic, caring for children, being in debt, or being in a difficult relationship. SIGNS AND SYMPTOMS Stress may cause emotional symptoms including, the following:  Anxiety. This is feeling worried, afraid, on edge, overwhelmed, or out of control.  Anger. This is feeling irritated or impatient.  Depression. This is feeling sad, down, helpless, or guilty.  Difficulty focusing, remembering, or making decisions. Stress may cause physical symptoms, including the following:   Aches and pains. These may affect your head, neck, back, stomach, or other areas of your body.  Tight muscles or clenched jaw.  Low energy or trouble sleeping. Stress may cause unhealthy behaviors, including the following:   Eating to feel better (overeating) or skipping meals.  Sleeping too little, too much, or both.  Working  too much or putting off tasks (procrastination).  Smoking, drinking alcohol, or using drugs to feel better. DIAGNOSIS  Stress is diagnosed through an assessment by your health care provider. Your health care provider will ask questions about your symptoms and any stressful life events.Your health care provider will also ask about your medical history and may order blood tests or other tests. Certain medical conditions and medicine can cause physical symptoms similar to stress. Mental illness can cause emotional symptoms and unhealthy behaviors similar to stress. Your health care provider may refer you to a mental health professional for further evaluation.  TREATMENT  Stress management is the recommended treatment for stress.The goals of stress management are reducing stressful life events and coping with stress in healthy ways.  Techniques for reducing stressful life events include the following:  Stress identification. Self-monitor for stress and identify what causes stress for you. These skills may help you to avoid some stressful events.  Time management. Set your priorities, keep a calendar of events, and learn to say "no." These tools can help you avoid making too many commitments. Techniques for coping with stress include the following:  Rethinking the problem. Try to think realistically about stressful events rather than ignoring them or overreacting. Try to find the positives in a stressful situation rather than focusing on the negatives.  Exercise. Physical exercise can release both physical and emotional tension. The key is to find a form of exercise you enjoy and do it regularly.  Relaxation techniques. These relax the body and mind. Examples include yoga, meditation, tai chi, biofeedback, deep  breathing, progressive muscle relaxation, listening to music, being out in nature, journaling, and other hobbies. Again, the key is to find one or more that you enjoy and can do  regularly.  Healthy lifestyle. Eat a balanced diet, get plenty of sleep, and do not smoke. Avoid using alcohol or drugs to relax.  Strong support network. Spend time with family, friends, or other people you enjoy being around.Express your feelings and talk things over with someone you trust. Counseling or talktherapy with a mental health professional may be helpful if you are having difficulty managing stress on your own. Medicine is typically not recommended for the treatment of stress.Talk to your health care provider if you think you need medicine for symptoms of stress. HOME CARE INSTRUCTIONS  Keep all follow-up visits as directed by your health care provider.  Take all medicines as directed by your health care provider. SEEK MEDICAL CARE IF:  Your symptoms get worse or you start having new symptoms.  You feel overwhelmed by your problems and can no longer manage them on your own. SEEK IMMEDIATE MEDICAL CARE IF:  You feel like hurting yourself or someone else. Document Released: 09/22/2000 Document Revised: 08/13/2013 Document Reviewed: 11/21/2012 ExitCare Patient Information 2015 [employer]. This information is not intended to replace advice given to you by your health care provider. Make sure you discuss any questions you have with your health care provider.  

## 2014-05-07 NOTE — Progress Notes (Signed)
   Subjective:    Patient ID: Heather Leblanc, female    DOB: [birth year], 64 y.o.   MRN: [id number]  HPI Patient in today with 2 complaints; - GAD- she increased xanax on her own and has been taking more then rx and ask for refill  early- she had been given a 90 day supply and took all of them in 55 days. She called requesting refill early and was very upset to find that we would not fill rx early. She was told that control meds need to be taken as rx and cannot increase dose without discussing with provider. Was put on celexa and she said that they made her heart race so she stopped taking.  - bursitis in right shoulder- started hurting 2 months ago- went to urgent care and they gave her an injection in shoulder and it is slowly improving.    Review of Systems  Constitutional: Negative.   HENT: Negative.   Respiratory: Negative.   Cardiovascular: Negative.   Genitourinary: Negative.   Neurological: Negative.   Psychiatric/Behavioral: Negative.   All other systems reviewed and are negative.      Objective:   Physical Exam  Constitutional: She is oriented to person, place, and time. She appears well-developed and well-nourished.  Cardiovascular: Normal rate, regular rhythm and normal heart sounds.   Pulmonary/Chest: Effort normal and breath sounds normal.  Musculoskeletal:  Slight pain on palpation of anterior right shoulder Gripe equal bil FROM with out pain right shoulder Motor strength and sensation intact distally  Neurological: She is alert and oriented to person, place, and time.  Psychiatric: She has a normal mood and affect. Her behavior is normal. Judgment and thought content normal.    BP 142/84 mmHg  Pulse 100  Temp(Src) 97.8 F (36.6 C) (Oral)  Ht 5\' 6"  (1.676 m)  Wt 165 lb (74.844 kg)  BMI 26.64 kg/m2        Assessment & Plan:  1. GAD (generalized anxiety disorder) Added lexapro-  Had long discussion with patient about controlled meds- she was given  contract to  Read and sign - escitalopram (LEXAPRO) 20 MG tablet; Take 1 tablet (20 mg total) by mouth daily.  Dispense: 30 tablet; Refill: 2 - ALPRAZolam (XANAX) 1 MG tablet; 1 po qd  Dispense: 30 tablet; Refill: 0  2. Shoulder bursitis, right Moist heat Rest If hurts to rais e above head do not do it. May need to see ortho if doesn't improve. - ibuprofen (ADVIL,MOTRIN) 600 MG tablet; Take 1 tablet (600 mg total) by mouth every 8 (eight) hours as needed.  Dispense: 30 tablet; Refill: [place], FNP

## 2014-05-08 ENCOUNTER — Other Ambulatory Visit: Payer: Self-pay | Admitting: Nurse Practitioner

## 2014-05-09 ENCOUNTER — Ambulatory Visit: Payer: Self-pay | Admitting: Nurse Practitioner

## 2014-05-09 NOTE — Telephone Encounter (Signed)
Last seen 05/07/14 MMM  Last thyroid lab 11/18/10

## 2014-05-30 ENCOUNTER — Other Ambulatory Visit: Payer: Self-pay | Admitting: Nurse Practitioner

## 2014-06-03 NOTE — Telephone Encounter (Signed)
Last seen 05/07/13 MMM  If approved route to  Nurse to call into [facility]

## 2014-06-03 NOTE — Telephone Encounter (Signed)
Please call in xanax with 1 refills 

## 2014-06-04 NOTE — Telephone Encounter (Signed)
Refill called to pharmacy.

## 2014-06-10 ENCOUNTER — Telehealth: Payer: Self-pay | Admitting: Family Medicine

## 2014-06-10 NOTE — Telephone Encounter (Signed)
Stp advised we don't have any openings for tomorrow but she can CB first thing in the morning to see if anyone has cancelled, pt voiced understanding.

## 2014-06-11 ENCOUNTER — Encounter: Payer: Self-pay | Admitting: Family Medicine

## 2014-06-11 ENCOUNTER — Ambulatory Visit (INDEPENDENT_AMBULATORY_CARE_PROVIDER_SITE_OTHER): Payer: 59

## 2014-06-11 ENCOUNTER — Encounter (INDEPENDENT_AMBULATORY_CARE_PROVIDER_SITE_OTHER): Payer: Self-pay

## 2014-06-11 ENCOUNTER — Ambulatory Visit (INDEPENDENT_AMBULATORY_CARE_PROVIDER_SITE_OTHER): Payer: 59 | Admitting: Family Medicine

## 2014-06-11 VITALS — BP 156/97 | HR 114 | Temp 98.0°F | Ht 66.0 in | Wt 161.0 lb

## 2014-06-11 DIAGNOSIS — R059 Cough, unspecified: Secondary | ICD-10-CM

## 2014-06-11 DIAGNOSIS — R05 Cough: Secondary | ICD-10-CM

## 2014-06-11 DIAGNOSIS — J0120 Acute ethmoidal sinusitis, unspecified: Secondary | ICD-10-CM

## 2014-06-11 LAB — POCT CBC
Granulocyte percent: 76.7 %G (ref 37–80)
HCT, POC: 46.7 % (ref 37.7–47.9)
Hemoglobin: 14.3 g/dL (ref 12.2–16.2)
LYMPH, POC: 2.9 (ref 0.6–3.4)
MCH: 27.6 pg (ref 27–31.2)
MCHC: 30.6 g/dL — AB (ref 31.8–35.4)
MCV: 90.2 fL (ref 80–97)
MPV: 8 fL (ref 0–99.8)
POC GRANULOCYTE: 11 — AB (ref 2–6.9)
POC LYMPH PERCENT: 20 %L (ref 10–50)
Platelet Count, POC: 291 10*3/uL (ref 142–424)
RBC: 5.18 M/uL (ref 4.04–5.48)
RDW, POC: 14.6 %
WBC: 14.4 10*3/uL — AB (ref 4.6–10.2)

## 2014-06-11 MED ORDER — AZITHROMYCIN 250 MG PO TABS: ORAL_TABLET | ORAL | Status: DC

## 2014-06-11 MED ORDER — ALPRAZOLAM 2 MG PO TABS: ORAL_TABLET | ORAL | Status: DC

## 2014-06-11 NOTE — Progress Notes (Signed)
   Subjective:    Patient ID: Heather Leblanc, female    DOB: 08/09/1950, 64 y.o.   MRN: [id number]  HPI 64 year old female with a several day history of cough productive of green sputum sinus congestion and postnasal drainage. She has not had any fever or chills. She is concerned about pneumonia but I think many of her concerns are related to her generalized anxiety disorder, which is worse since she has had her alprazolam reduced from 4 mg a day to 1 mg a day. She is worried about heart disease. She is taking Lexapro in addition to Xanax but still seems very anxious.    Review of Systems  Constitutional: Negative.   HENT: Positive for sinus pressure.   Respiratory: Positive for cough.   Cardiovascular: Negative.   Neurological: Negative.     Patient Active Problem List   Diagnosis Date Noted  . Urinary tract infectious disease 01/31/2014  . Hydradenitis 01/31/2014  . Peripheral edema 12/03/2013  . White coat syndrome without hypertension 12/03/2013  . Hypothyroidism 12/08/2012  . Seizures 12/08/2012  . GAD (generalized anxiety disorder) 12/08/2012   Outpatient Encounter Prescriptions as of 06/11/2014  Medication Sig  . aspirin 325 MG tablet Take 325 mg by mouth 2 (two) times daily.  . Cholecalciferol (VITAMIN D) 2000 UNITS CAPS Take 2,000 Units by mouth.  . levothyroxine (SYNTHROID, LEVOTHROID) 112 MCG tablet Take 112 mcg by mouth daily before breakfast.  . Omega-3 Fatty Acids (FISH OIL PO) Take 3,600 mg by mouth.  . ALPRAZolam (XANAX) 1 MG tablet TAKE 1 TABLET DAILY  . escitalopram (LEXAPRO) 20 MG tablet Take 1 tablet (20 mg total) by mouth daily.  . furosemide (LASIX) 20 MG tablet Take 1 tablet (20 mg total) by mouth daily.  Marland Kitchen ibuprofen (ADVIL,MOTRIN) 600 MG tablet Take 1 tablet (600 mg total) by mouth every 8 (eight) hours as needed.  . [DISCONTINUED] levothyroxine (SYNTHROID, LEVOTHROID) 88 MCG tablet TAKE ONE TABLET BY MOUTH ONCE DAILY BEFORE  BREAKFAST      Objective:   Physical Exam  Constitutional: She is oriented to person, place, and time. She appears well-developed.  HENT:  There is bilateral maxillary sinus tenderness with percussion  Cardiovascular: Normal rate and regular rhythm.   Pulmonary/Chest: Effort normal and breath sounds normal.  Neurological: She is alert and oriented to person, place, and time.  Psychiatric: She has a normal mood and affect. Her behavior is normal.          Assessment & Plan:  1. Cough Treat with a Z-Pak, plenty of fluids, and Mucinex to loosen cough - DG Chest 2 View; Future - POCT CBC  2. Subacute ethmoidal sinusitis Wardell Honour MD

## 2014-06-17 ENCOUNTER — Emergency Department (HOSPITAL_COMMUNITY): Payer: PRIVATE HEALTH INSURANCE

## 2014-06-17 ENCOUNTER — Encounter: Payer: Self-pay | Admitting: Family Medicine

## 2014-06-17 ENCOUNTER — Encounter (HOSPITAL_COMMUNITY): Payer: Self-pay | Admitting: Emergency Medicine

## 2014-06-17 ENCOUNTER — Ambulatory Visit (INDEPENDENT_AMBULATORY_CARE_PROVIDER_SITE_OTHER): Payer: 59 | Admitting: Family Medicine

## 2014-06-17 ENCOUNTER — Emergency Department (HOSPITAL_COMMUNITY)
Admission: EM | Admit: 2014-06-17 | Discharge: 2014-06-17 | Disposition: A | Discharge status: Home / Self Care | Payer: PRIVATE HEALTH INSURANCE | Attending: Emergency Medicine | Admitting: Emergency Medicine

## 2014-06-17 VITALS — BP 150/80 | HR 91 | Temp 97.4°F | Resp 16 | Ht 66.0 in | Wt 167.0 lb

## 2014-06-17 DIAGNOSIS — Z8669 Personal history of other diseases of the nervous system and sense organs: Secondary | ICD-10-CM | POA: Diagnosis not present

## 2014-06-17 DIAGNOSIS — Z880 Allergy status to penicillin: Secondary | ICD-10-CM | POA: Diagnosis not present

## 2014-06-17 DIAGNOSIS — F419 Anxiety disorder, unspecified: Secondary | ICD-10-CM | POA: Insufficient documentation

## 2014-06-17 DIAGNOSIS — R531 Weakness: Secondary | ICD-10-CM

## 2014-06-17 DIAGNOSIS — Z87448 Personal history of other diseases of urinary system: Secondary | ICD-10-CM | POA: Diagnosis not present

## 2014-06-17 DIAGNOSIS — Z79899 Other long term (current) drug therapy: Secondary | ICD-10-CM | POA: Diagnosis not present

## 2014-06-17 DIAGNOSIS — R079 Chest pain, unspecified: Secondary | ICD-10-CM | POA: Insufficient documentation

## 2014-06-17 DIAGNOSIS — Z8672 Personal history of thrombophlebitis: Secondary | ICD-10-CM | POA: Insufficient documentation

## 2014-06-17 DIAGNOSIS — F329 Major depressive disorder, single episode, unspecified: Secondary | ICD-10-CM | POA: Diagnosis not present

## 2014-06-17 DIAGNOSIS — F411 Generalized anxiety disorder: Secondary | ICD-10-CM | POA: Diagnosis not present

## 2014-06-17 DIAGNOSIS — E039 Hypothyroidism, unspecified: Secondary | ICD-10-CM | POA: Insufficient documentation

## 2014-06-17 DIAGNOSIS — Z872 Personal history of diseases of the skin and subcutaneous tissue: Secondary | ICD-10-CM | POA: Insufficient documentation

## 2014-06-17 DIAGNOSIS — I209 Angina pectoris, unspecified: Secondary | ICD-10-CM

## 2014-06-17 DIAGNOSIS — R002 Palpitations: Secondary | ICD-10-CM | POA: Diagnosis not present

## 2014-06-17 LAB — BASIC METABOLIC PANEL
ANION GAP: 9 (ref 5–15)
BUN: 9 mg/dL (ref 6–23)
CALCIUM: 9.7 mg/dL (ref 8.4–10.5)
CHLORIDE: 103 mmol/L (ref 96–112)
CO2: 27 mmol/L (ref 19–32)
Creatinine, Ser: 0.59 mg/dL (ref 0.50–1.10)
GFR calc Af Amer: >90 mL/min (ref >90)
GFR calc non Af Amer: >90 mL/min (ref >90)
GLUCOSE: 90 mg/dL (ref 70–99)
Potassium: 4.4 mmol/L (ref 3.5–5.1)
Sodium: 139 mmol/L (ref 135–145)

## 2014-06-17 LAB — CBC
HCT: 38.9 % (ref 36.0–46.0)
Hemoglobin: 12.8 g/dL (ref 12.0–15.0)
MCH: 29.5 pg (ref 26.0–34.0)
MCHC: 32.9 g/dL (ref 30.0–36.0)
MCV: 89.6 fL (ref 78.0–100.0)
PLATELETS: 222 10*3/uL (ref 150–400)
RBC: 4.34 MIL/uL (ref 3.87–5.11)
RDW: 13.9 % (ref 11.5–15.5)
WBC: 12.1 10*3/uL — ABNORMAL HIGH (ref 4.0–10.5)

## 2014-06-17 LAB — GLUCOSE, POCT (MANUAL RESULT ENTRY): POC Glucose: 77 mg/dl (ref 70–99)

## 2014-06-17 LAB — I-STAT TROPONIN, ED: Troponin i, poc: 0.01 ng/mL (ref 0.00–0.08)

## 2014-06-17 LAB — D-DIMER, QUANTITATIVE: D-Dimer, Quant: 0.61 ug/mL-FEU — ABNORMAL HIGH (ref 0.00–0.48)

## 2014-06-17 LAB — TSH: TSH: 0.57 u[IU]/mL (ref 0.350–4.500)

## 2014-06-17 MED ORDER — TECHNETIUM TO 99M ALBUMIN AGGREGATED
6.0000 | Freq: Once | INTRAVENOUS | Status: AC | PRN
  Administered 2014-06-17: 6 via INTRAVENOUS

## 2014-06-17 MED ORDER — TECHNETIUM TC 99M DIETHYLENETRIAME-PENTAACETIC ACID: 40.0000 | Freq: Once | INTRAVENOUS | Status: DC | PRN

## 2014-06-17 MED ORDER — LORAZEPAM 2 MG/ML IJ SOLN
1.0000 mg | Freq: Once | INTRAMUSCULAR | Status: AC
  Administered 2014-06-17: 1 mg via INTRAVENOUS
  Filled 2014-06-17: qty 1

## 2014-06-17 NOTE — ED Notes (Addendum)
Per EMS: intermittent chest wall pain and palpitations vs anxiety.  Patient states her heart has been erratic since her doctor put her on Celexa.   Currently denies pain.  Was given 324 asa by [facility], 20 gauge left forearm.   12 lead unremarkable.  Been under a lot of stress lately.  States she also had a blood clot in her groin.

## 2014-06-17 NOTE — Discharge Instructions (Signed)
Read the information below.  You may return to the Emergency Department at any time for worsening condition or any new symptoms that concern you.    Your caregiver has diagnosed you as having chest pain that is not specific for one problem, but does not require admission.  You are at low risk for an acute heart condition or other serious illness. Chest pain comes from many different causes.  SEEK IMMEDIATE MEDICAL ATTENTION IF: You have severe chest pain, especially if the pain is crushing or pressure-like and spreads to the arms, back, neck, or jaw, or if you have sweating, nausea (feeling sick to your stomach), or shortness of breath. THIS IS AN EMERGENCY. Don't wait to see if the pain will go away. Get medical help at once. Call 911 or 0 (operator). DO NOT drive yourself to the hospital.  Your chest pain gets worse and does not go away with rest.  You have an attack of chest pain lasting longer than usual, despite rest and treatment with the medications your caregiver has prescribed.  You wake from sleep with chest pain or shortness of breath.  You feel dizzy or faint.  You have chest pain not typical of your usual pain for which you originally saw your caregiver.   Palpitations A palpitation is the feeling that your heartbeat is irregular or is faster than normal. It may feel like your heart is fluttering or skipping a beat. Palpitations are usually not a serious problem. However, in some cases, you may need further medical evaluation. CAUSES  Palpitations can be caused by:  Smoking.  Caffeine or other stimulants, such as diet pills or energy drinks.  Alcohol.  Stress and anxiety.  Strenuous physical activity.  Fatigue.  Certain medicines.  Heart disease, especially if you have a history of irregular heart rhythms (arrhythmias), such as atrial fibrillation, atrial flutter, or supraventricular tachycardia.  An improperly working pacemaker or defibrillator. DIAGNOSIS  To find the  cause of your palpitations, your health care provider will take your medical history and perform a physical exam. Your health care provider may also have you take a test called an ambulatory electrocardiogram (ECG). An ECG records your heartbeat patterns over a 24-hour period. You may also have other tests, such as:  Transthoracic echocardiogram (TTE). During echocardiography, sound waves are used to evaluate how blood flows through your heart.  Transesophageal echocardiogram (TEE).  Cardiac monitoring. This allows your health care provider to monitor your heart rate and rhythm in real time.  Holter monitor. This is a portable device that records your heartbeat and can help diagnose heart arrhythmias. It allows your health care provider to track your heart activity for several days, if needed.  Stress tests by exercise or by giving medicine that makes the heart beat faster. TREATMENT  Treatment of palpitations depends on the cause of your symptoms and can vary greatly. Most cases of palpitations do not require any treatment other than time, relaxation, and monitoring your symptoms. Other causes, such as atrial fibrillation, atrial flutter, or supraventricular tachycardia, usually require further treatment. HOME CARE INSTRUCTIONS   Avoid:  Caffeinated coffee, tea, soft drinks, diet pills, and energy drinks.  Chocolate.  Alcohol.  Stop smoking if you smoke.  Reduce your stress and anxiety. Things that can help you relax include:  A method of controlling things in your body, such as your heartbeats, with your mind (biofeedback).  Yoga.  Meditation.  Physical activity such as swimming, jogging, or walking.  Get plenty of  rest and sleep. SEEK MEDICAL CARE IF:   You continue to have a fast or irregular heartbeat beyond 24 hours.  Your palpitations occur more often. SEEK IMMEDIATE MEDICAL CARE IF:  You have chest pain or shortness of breath.  You have a severe headache.  You  feel dizzy or you faint. MAKE SURE YOU:  Understand these instructions.  Will watch your condition.  Will get help right away if you are not doing well or get worse. Document Released: 03/26/2000 Document Revised: 04/03/2013 Document Reviewed: 05/28/2011 [facility] Patient Information 2015 [place], [place]. This information is not intended to replace advice given to you by your health care provider. Make sure you discuss any questions you have with your health care provider.  Chest Pain (Nonspecific) It is often hard to give a specific diagnosis for the cause of chest pain. There is always a chance that your pain could be related to something serious, such as a heart attack or a blood clot in the lungs. You need to follow up with your health care provider for further evaluation. CAUSES   Heartburn.  Pneumonia or bronchitis.  Anxiety or stress.  Inflammation around your heart (pericarditis) or lung (pleuritis or pleurisy).  A blood clot in the lung.  A collapsed lung (pneumothorax). It can develop suddenly on its own (spontaneous pneumothorax) or from trauma to the chest.  Shingles infection (herpes zoster virus). The chest wall is composed of bones, muscles, and cartilage. Any of these can be the source of the pain.  The bones can be bruised by injury.  The muscles or cartilage can be strained by coughing or overwork.  The cartilage can be affected by inflammation and become sore (costochondritis). DIAGNOSIS  Lab tests or other studies may be needed to find the cause of your pain. Your health care provider may have you take a test called an ambulatory electrocardiogram (ECG). An ECG records your heartbeat patterns over a 24-hour period. You may also have other tests, such as:  Transthoracic echocardiogram (TTE). During echocardiography, sound waves are used to evaluate how blood flows through your heart.  Transesophageal echocardiogram (TEE).  Cardiac monitoring. This allows  your health care provider to monitor your heart rate and rhythm in real time.  Holter monitor. This is a portable device that records your heartbeat and can help diagnose heart arrhythmias. It allows your health care provider to track your heart activity for several days, if needed.  Stress tests by exercise or by giving medicine that makes the heart beat faster. TREATMENT   Treatment depends on what may be causing your chest pain. Treatment may include:  Acid blockers for heartburn.  Anti-inflammatory medicine.  Pain medicine for inflammatory conditions.  Antibiotics if an infection is present.  You may be advised to change lifestyle habits. This includes stopping smoking and avoiding alcohol, caffeine, and chocolate.  You may be advised to keep your head raised (elevated) when sleeping. This reduces the chance of acid going backward from your stomach into your esophagus. Most of the time, nonspecific chest pain will improve within 2-3 days with rest and mild pain medicine.  HOME CARE INSTRUCTIONS   If antibiotics were prescribed, take them as directed. Finish them even if you start to feel better.  For the next few days, avoid physical activities that bring on chest pain. Continue physical activities as directed.  Do not use any tobacco products, including cigarettes, chewing tobacco, or electronic cigarettes.  Avoid drinking alcohol.  Only take medicine as directed  by your health care provider.  Follow your health care provider's suggestions for further testing if your chest pain does not go away.  Keep any follow-up appointments you made. If you do not go to an appointment, you could develop lasting (chronic) problems with pain. If there is any problem keeping an appointment, call to reschedule. SEEK MEDICAL CARE IF:   Your chest pain does not go away, even after treatment.  You have a rash with blisters on your chest.  You have a fever. SEEK IMMEDIATE MEDICAL CARE IF:     You have increased chest pain or pain that spreads to your arm, neck, jaw, back, or abdomen.  You have shortness of breath.  You have an increasing cough, or you cough up blood.  You have severe back or abdominal pain.  You feel nauseous or vomit.  You have severe weakness.  You faint.  You have chills. This is an emergency. Do not wait to see if the pain will go away. Get medical help at once. Call your local emergency services (911 in [place] Do not drive yourself to the hospital. MAKE SURE YOU:   Understand these instructions.  Will watch your condition.  Will get help right away if you are not doing well or get worse. Document Released: 01/06/2005 Document Revised: 04/03/2013 Document Reviewed: 11/02/2007 [facility] Patient Information 2015 [place], [place]. This information is not intended to replace advice given to you by your health care provider. Make sure you discuss any questions you have with your health care provider.

## 2014-06-17 NOTE — ED Provider Notes (Signed)
CSN: [id number]     Arrival date & time    History   First MD Initiated Contact with Patient 06/17/14 1605     Chief Complaint  Patient presents with  . Chest Pain  . Anxiety  . Palpitations     (Consider location/radiation/quality/duration/timing/severity/associated sxs/prior Treatment) HPI   Pt with hx anxiety, thyroid dysfunction sent to ED for palpitations and chest pain by [facility].  Pt states she has not felt well for one year. Has had several medication changes including decrease of her anxiety meds and additional of celexa, which she stopped on her own.  Was sent to urgent care by her vein specialist who is treating her with sclerotherapy and for superficial thrombophlebitis for which she takes aspirin.  States she has had the palpitations, described as heart pounding and racing for the past 7-8 months, worse in the past 1.5 months.  She also notes she has occasional chest pain x 1.5 months.  This pain is sharp, lasts a few seconds at a time, is random.  She has had the pain 3 times today.  It is not exertional, pleuritic, positional, or related to eating.   Denies any change in her chronic leg swelling.   She was seen 3/1 by her new PCP for sinusitis and was given a z-pak with great improvement.    Past Medical History  Diagnosis Date  . Hypothyroidism   . Anxiety   . Eczema   . Cystitis   . Premature menopause   . History of phlebitis   . 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 History of seasonal allergies   . Grand mal seizure     Hx  (1)  . Depression    Past Surgical History  Procedure Laterality Date  . Right leg vein surgery     No family history on file. History  Substance Use Topics  . Smoking status: Never Smoker   . Smokeless tobacco: Never Used  . Alcohol Use: No   OB History    No data available     Review of Systems  All other systems reviewed and are negative.     Allergies  Celexa; Cinobac; Ciprofloxacin; Iohexol; Ivp dye;  Penicillins; Phenobarbital; Shellfish allergy; and Tegretol  Home Medications   Prior to Admission medications   Medication Sig Start Date End Date Taking? Authorizing Provider  alprazolam Duanne Moron) 2 MG tablet 1 tab BID 06/11/14   Wardell Honour, MD  aspirin 325 MG tablet Take 325 mg by mouth 2 (two) times daily.    Historical Provider, MD  azithromycin (ZITHROMAX) 250 MG tablet Take 2 tab stat, then 1 tab qd x 4 days Patient not taking: Reported on 06/17/2014 06/11/14   Wardell Honour, MD  Cholecalciferol (VITAMIN D) 2000 UNITS CAPS Take 2,000 Units by mouth.    Historical Provider, MD  escitalopram (LEXAPRO) 20 MG tablet Take 1 tablet (20 mg total) by mouth daily. Patient not taking: Reported on 06/17/2014 05/07/14   Antara-Margaret Hassell Done, FNP  furosemide (LASIX) 20 MG tablet Take 1 tablet (20 mg total) by mouth daily. 12/03/13   Anne-Marie-Margaret Hassell Done, FNP  ibuprofen (ADVIL,MOTRIN) 600 MG tablet Take 1 tablet (600 mg total) by mouth every 8 (eight) hours as needed. 05/07/14   Livana-Margaret Hassell Done, FNP  levothyroxine (SYNTHROID, LEVOTHROID) 112 MCG tablet Take 112 mcg by mouth daily before breakfast.    Historical Provider, MD  Omega-3 Fatty Acids (FISH OIL PO) Take 3,600 mg by mouth.  Historical Provider, MD   BP 133/77 mmHg  Pulse 84  Temp(Src) 98.3 F (36.8 C) (Oral)  Resp 13  Ht 5\' 6"  (1.676 m)  Wt 165 lb (74.844 kg)  BMI 26.64 kg/m2  SpO2 100% Physical Exam  Constitutional: She appears well-developed and well-nourished. No distress.  HENT:  Head: Normocephalic and atraumatic.  Neck: Neck supple.  Cardiovascular: Normal rate.  An irregular rhythm present.  Pulmonary/Chest: Effort normal and breath sounds normal. No respiratory distress. She has no wheezes. She has no rales.  Abdominal: Soft. She exhibits no distension. There is no tenderness. There is no rebound and no guarding.  Neurological: She is alert.  Skin: She is not diaphoretic.  Psychiatric: Her mood appears anxious.   Nursing note and vitals reviewed.   ED Course  Procedures (including critical care time) Labs Review Labs Reviewed  CBC - Abnormal; Notable for the following:    WBC 12.1 (*)    All other components within normal limits  D-DIMER, QUANTITATIVE - Abnormal; Notable for the following:    D-Dimer, Quant 0.61 (*)    All other components within normal limits  BASIC METABOLIC PANEL  TSH  I-STAT TROPOININ, ED    Imaging Review Dg Chest Port 1 View  06/17/2014   CLINICAL DATA:  Rapid heartbeat started today  EXAM: PORTABLE CHEST - 1 VIEW  COMPARISON:  06/11/2014  FINDINGS: Normal mediastinum and cardiac silhouette. Normal pulmonary vasculature. No evidence of effusion, infiltrate, or pneumothorax. No acute bony abnormality.  IMPRESSION: No acute cardiopulmonary process.   Electronically Signed   By: Suzy Bouchard M.D.   On: 06/17/2014 16:22     EKG Interpretation   Date/Time:  Monday June 17 2014 15:51:21 EST Ventricular Rate:  87 PR Interval:  189 QRS Duration: 85 QT Interval:  350 QTC Calculation: 421 R Axis:   9 Text Interpretation:  Sinus rhythm Probable left atrial enlargement No old  tracing to compare Confirmed by [facility]  MD, MARTHA [phone]) on 06/17/2014  7:21:02 PM          MDM   Final diagnoses:  Chest pain  Palpitations    Afebrile, nontoxic patient with hx anxiety,  C/o 1.5 months of constant palpitations, occasional atypical chest pain.  Sent from urgent care.  Workup unremarkable including troponin, VQ scan, labs including TSH.  Suspect anxiety is a component to this.  Doubt ACS, PE.   D/C home with PCP follow up.  Discussed result, findings, treatment, and follow up  with patient.  Pt given return precautions.  Pt verbalizes understanding and agrees with plan.         Clayton Bibles, PA-C 06/17/14 2137  Alfonzo Beers, MD 06/17/14 2145

## 2014-06-17 NOTE — ED Notes (Signed)
Pt. Left with all belongings 

## 2014-06-17 NOTE — Progress Notes (Signed)
[facility] [address], [place] [address] [phone]  Date:  06/17/2014   Name:  Heather Leblanc   DOB:  1950/06/09   MRN:  [id number]  PCP:  Chevis Pretty, FNP    Chief Complaint: Chest Pain   History of Present Illness:  Heather Leblanc is a 64 y.o. very pleasant female patient who presents with the following:  She was at the vein clinic today for a procedure and told her doctor there "about my heart."  She has noted a "very erratic, very pounding pulse, it was 110 at my doctor last week."  She recently finished a zpack for a sinus infection a few days ago.  She notes that her heart has been irregular since August when she started on Celexa.  She stopped the celexa but "I could not leave the house for 2 months."  She feels that taking celexa caused her to be unable to leave home for 2 months and that it caused her to have an irregular heart rate since then.   She also notes chest pain over the last 4-5 days. This will come and go.   She is taking 325 aspirin BID on the advice of her vein doctor She does not notice the CP now but her "heat is very erratic."   She does not have any history of heart problems.  Her parents however had history of heart disease.    She states that "I am not even able to function right now."  This has been the case for one week.  She states that she cannot stand up, that she feels very weak and strange.    She does have a long history of anxiety.   Patient Active Problem List   Diagnosis Date Noted  . Urinary tract infectious disease 01/31/2014  . Hydradenitis 01/31/2014  . Peripheral edema 12/03/2013  . White coat syndrome without hypertension 12/03/2013  . Hypothyroidism 12/08/2012  . Seizures 12/08/2012  . GAD (generalized anxiety disorder) 12/08/2012    Past Medical History  Diagnosis Date  . Hypothyroidism   . Anxiety   . Eczema   . Cystitis   . Premature menopause   . History of phlebitis   . Allergy to  ertapenem   . History of seasonal allergies   . Grand mal seizure     Hx  (1)  . Depression     Past Surgical History  Procedure Laterality Date  . Right leg vein surgery      History  Substance Use Topics  . Smoking status: Never Smoker   . Smokeless tobacco: Never Used  . Alcohol Use: No    No family history on file.  Allergies  Allergen Reactions  . Celexa [Citalopram Hydrobromide]   . Cinobac [Cinoxacin]   . Ciprofloxacin   . Iohexol      Desc: chest pressure/sob 1990's with an IVP   kdean   . Ivp Dye [Iodinated Diagnostic Agents]   . Penicillins   . Phenobarbital   . Shellfish Allergy   . Tegretol [Carbamazepine]     Medication list has been reviewed and updated.  Current Outpatient Prescriptions on File Prior to Visit  Medication Sig Dispense Refill  . alprazolam (XANAX) 2 MG tablet 1 tab BID 60 tablet 0  . aspirin 325 MG tablet Take 325 mg by mouth 2 (two) times daily.    . Cholecalciferol (VITAMIN D) 2000 UNITS CAPS Take 2,000 Units by mouth.    Marland Kitchen  furosemide (LASIX) 20 MG tablet Take 1 tablet (20 mg total) by mouth daily. 30 tablet 3  . ibuprofen (ADVIL,MOTRIN) 600 MG tablet Take 1 tablet (600 mg total) by mouth every 8 (eight) hours as needed. 30 tablet 1  . levothyroxine (SYNTHROID, LEVOTHROID) 112 MCG tablet Take 112 mcg by mouth daily before breakfast.    . Omega-3 Fatty Acids (FISH OIL PO) Take 3,600 mg by mouth.    Marland Kitchen azithromycin (ZITHROMAX) 250 MG tablet Take 2 tab stat, then 1 tab qd x 4 days (Patient not taking: Reported on 06/17/2014) 6 each 0  . escitalopram (LEXAPRO) 20 MG tablet Take 1 tablet (20 mg total) by mouth daily. (Patient not taking: Reported on 06/17/2014) 30 tablet 2   No current facility-administered medications on file prior to visit.    Review of Systems:  As per HPI- otherwise negative.   Physical Examination: Filed Vitals:   06/17/14 1425  BP: 150/80  Pulse: 91  Temp: 97.4 F (36.3 C)  Resp: 16   Filed Vitals:    06/17/14 1425  Height: 5\' 6"  (1.676 m)  Weight: 167 lb (75.751 kg)   Body mass index is 26.97 kg/(m^2). Ideal Body Weight: Weight in (lb) to have BMI = 25: 154.6  GEN: WDWN, NAD, Non-toxic, A & O x 3, seems anxious.  Will not sit up in room, "I feel like I'm going to pass out."   HEENT: Atraumatic, Normocephalic. Neck supple. No masses, No LAD.   Ears and Nose: No external deformity. CV: RRR, No M/G/R. No JVD. No thrill. No extra heart sounds.  No arrhythmia appreciated with long listen to her heart  PULM: CTA B, no wheezes, crackles, rhonchi. No retractions. No resp. distress. No accessory muscle use. ABD: S, NT, ND, +BS. No rebound. No HSM. EXTR: No c/c/e NEURO Normal gait.  PSYCH: Normally interactive. Conversant.   EKG shows NSR, benign appearing EKG After EKG she states that she was having "a little" chest pain  Results for orders placed or performed in visit on 06/17/14  POCT glucose (manual entry)  Result Value Ref Range   POC Glucose 77 70 - 99 mg/dl   Given apple juice to drink  Assessment and Plan: Chest pain, unspecified chest pain type - Plan: EKG 12-Lead  GAD (generalized anxiety disorder)  Here today with chest pain/ palpitations for several days.  Will refer to ER for further evaluation and CP rule- out.  However assuming that her rule- out is negative her sx are likely due to anxiety.    Noted history of seizures per chart but she is not on any medications for same  Signed Lamar Blinks, MD

## 2014-06-19 ENCOUNTER — Telehealth: Payer: Self-pay | Admitting: Nurse Practitioner

## 2014-06-20 NOTE — Telephone Encounter (Signed)
Appointment scheduled for 3/18 at 4:00 with [facility].

## 2014-06-28 ENCOUNTER — Ambulatory Visit (INDEPENDENT_AMBULATORY_CARE_PROVIDER_SITE_OTHER): Payer: 59 | Admitting: Family Medicine

## 2014-06-28 ENCOUNTER — Encounter: Payer: Self-pay | Admitting: Family Medicine

## 2014-06-28 VITALS — BP 138/86 | HR 106 | Temp 99.0°F | Ht 66.0 in | Wt 165.0 lb

## 2014-06-28 DIAGNOSIS — R002 Palpitations: Secondary | ICD-10-CM

## 2014-06-28 DIAGNOSIS — E785 Hyperlipidemia, unspecified: Secondary | ICD-10-CM

## 2014-06-28 MED ORDER — LEVOTHYROXINE SODIUM 100 MCG PO TABS: 100.0000 ug | ORAL_TABLET | Freq: Every day | ORAL | Status: DC

## 2014-06-28 NOTE — Patient Instructions (Signed)

## 2014-06-28 NOTE — Progress Notes (Signed)
   Subjective:    Patient ID: Heather Leblanc, female    DOB: [birth year], 64 y.o.   MRN: [id number]  HPI 64 year old female who is here to follow-up ER visit in which she had some palpitations and to a lesser extent chest pain. There were never any EKG changes but some observers appreciated an irregular beat per history. She does have some anxieties and had been at the vein clinic when symptoms began.  Patient Active Problem List   Diagnosis Date Noted  . Urinary tract infectious disease 01/31/2014  . Hydradenitis 01/31/2014  . Peripheral edema 12/03/2013  . White coat syndrome without hypertension 12/03/2013  . Hypothyroidism 12/08/2012  . Seizures 12/08/2012  . GAD (generalized anxiety disorder) 12/08/2012   Outpatient Encounter Prescriptions as of 06/28/2014  Medication Sig  . alprazolam (XANAX) 2 MG tablet 1 tab BID (Patient taking differently: Take 2 mg by mouth 2 (two) times daily as needed for anxiety. )  . aspirin 325 MG tablet Take 325 mg by mouth 2 (two) times daily.  . Cholecalciferol (VITAMIN D) 2000 UNITS CAPS Take 2,000 Units by mouth daily.   Marland Kitchen escitalopram (LEXAPRO) 20 MG tablet Take 1 tablet (20 mg total) by mouth daily.  . furosemide (LASIX) 20 MG tablet Take 1 tablet (20 mg total) by mouth daily. (Patient taking differently: Take 20 mg by mouth as needed for fluid or edema. )  . ibuprofen (ADVIL,MOTRIN) 600 MG tablet Take 1 tablet (600 mg total) by mouth every 8 (eight) hours as needed.  Marland Kitchen levothyroxine (SYNTHROID, LEVOTHROID) 112 MCG tablet Take 112 mcg by mouth daily before breakfast.  . Omega-3 Fatty Acids (FISH OIL PO) Take 1,000-2,000 mg by mouth 2 (two) times daily. 2 caos in am, 1 cap in pm  . [DISCONTINUED] azithromycin (ZITHROMAX) 250 MG tablet Take 2 tab stat, then 1 tab qd x 4 days (Patient not taking: Reported on 06/17/2014)     Review of Systems  Cardiovascular: Positive for chest pain, palpitations and leg swelling.  Psychiatric/Behavioral: The patient is  nervous/anxious.        Objective:   Physical Exam  Constitutional: She appears well-developed and well-nourished.  Cardiovascular: Normal rate.   Mildly tachycardic but after about 2 minutes of auscultation I heard no irregular or premature beats.    BP 138/86 mmHg  Pulse 106  Temp(Src) 99 F (37.2 C) (Oral)  Ht 5\' 6"  (1.676 m)  Wt 165 lb (74.844 kg)  BMI 26.64 kg/m2        Assessment & Plan:  1. Palpitations I suspect anxiety is a role but I cannot rule out paroxysmal A. fib etc. and I am hoping an event monitor might be applied to rule out out - Ambulatory referral to Cardiology  2. Hyperlipidemia History of elevated LDL but patient has always declined use of statins. Now with her current symptoms of chest pain and palpitations she is more willing to consider initiation of therapy if oriented - Lipid panel

## 2014-06-29 LAB — LIPID PANEL
CHOL/HDL RATIO: 3.8 ratio (ref 0.0–4.4)
Cholesterol, Total: 262 mg/dL — ABNORMAL HIGH (ref 100–199)
HDL: 69 mg/dL (ref >39)
LDL Calculated: 165 mg/dL — ABNORMAL HIGH (ref 0–99)
Triglycerides: 140 mg/dL (ref 0–149)
VLDL Cholesterol Cal: 28 mg/dL (ref 5–40)

## 2014-07-01 ENCOUNTER — Other Ambulatory Visit: Payer: Self-pay | Admitting: *Deleted

## 2014-07-01 MED ORDER — ATORVASTATIN CALCIUM 20 MG PO TABS
20.0000 mg | ORAL_TABLET | Freq: Every day | ORAL | Status: DC
Start: 2014-07-01 — End: 2014-12-31

## 2014-07-08 ENCOUNTER — Other Ambulatory Visit: Payer: Self-pay | Admitting: Family Medicine

## 2014-07-08 NOTE — Telephone Encounter (Signed)
Last seen 06/28/14 Dr Sabra Heck  Last filled 06/11/14  If approved route to nurse to call in to [facility]

## 2014-07-10 ENCOUNTER — Ambulatory Visit: Payer: 59 | Admitting: *Deleted

## 2014-07-10 ENCOUNTER — Ambulatory Visit (INDEPENDENT_AMBULATORY_CARE_PROVIDER_SITE_OTHER): Payer: 59 | Admitting: Cardiology

## 2014-07-10 ENCOUNTER — Encounter: Payer: Self-pay | Admitting: Cardiology

## 2014-07-10 VITALS — BP 98/70 | HR 75 | Ht 66.0 in | Wt 161.0 lb

## 2014-07-10 DIAGNOSIS — R002 Palpitations: Secondary | ICD-10-CM

## 2014-07-10 NOTE — Patient Instructions (Signed)
Holter Monitoring A Holter monitor is a small device with electrodes (small sticky patches) that attach to your chest. It records the electrical activity of your heart and is worn continuously for 24-48 hours.  A HOLTER MONITOR IS USED TO  Detect heart problems such as:  Heart arrhythmia. Is an abnormal or irregular heartbeat. With some heart arrhythmias, you may not feel or know that you have an irregular heart rhythm.  Palpitations, such as feeling your heart racing or fluttering. It is possible to have heart palpitations and not have a heart arrhythmia.  A heart rhythm that is too slow or too fast.  If you have problems fainting, near fainting or feeling light-headed, a Holter monitor may be worn to see if your heart is the cause. HOLTER MONITOR PREPARATION   Electrodes will be attached to the skin on your chest.  If you have hair on your chest, small areas may have to be shaved. This is done to help the patches stick better and make the recording more accurate.  The electrodes are attached by wires to the Holter monitor. The Holter monitor clips to your clothing. You will wear the monitor at all times, even while exercising and sleeping. HOME CARE INSTRUCTIONS   Wear your monitor at all times.  The wires and the monitor must stay dry. Do not get the monitor wet.  Do not bathe, swim or use a hot tub with it on.  You may do a "sponge" bath while you have the monitor on.  Keep your skin clean, do not put body lotion or moisturizer on your chest.  It's possible that your skin under the electrodes could become irritated. To keep this from happening, you may put the electrodes in slightly different places on your chest.  Your caregiver will also ask you to keep a diary of your activities, such as walking or doing chores. Be sure to note what you are doing if you experience heart symptoms such as palpitations. This will help your caregiver determine what might be contributing to your  symptoms. The information stored in your monitor will be reviewed by your caregiver alongside your diary entries.  Make sure the monitor is safely clipped to your clothing or in a location close to your body that your caregiver recommends.  The monitor and electrodes are removed when the test is over. Return the monitor as directed.  Be sure to follow up with your caregiver and discuss your Holter monitor results. SEEK IMMEDIATE MEDICAL CARE IF:  You faint or feel lightheaded.  You have trouble breathing.  You get pain in your chest, upper arm or jaw.  You feel sick to your stomach and your skin is pale, cool, or damp.  You think something is wrong with the way your heart is beating. MAKE SURE YOU:   Understand these instructions.  Will watch your condition.  Will get help right away if you are not doing well or get worse. Document Released: 12/26/2003 Document Revised: 06/21/2011 Document Reviewed: 05/09/2008 ExitCare Patient Information 2015 [employer]. This information is not intended to replace advice given to you by your health care provider. Make sure you discuss any questions you have with your health care provider.  

## 2014-07-10 NOTE — Progress Notes (Signed)
Pt came in to have holter monitor placed ordered by Dr.Hochrein for palpitations. Holter monitor placed and instructions given to pt.

## 2014-07-10 NOTE — Progress Notes (Signed)
Cardiology Office Note   Date:  07/10/2014   ID:  Heather Leblanc, DOB 10/05/1950, MRN [id number]  PCP:  Heather Honour, MD  Cardiologist:   Minus Breeding, MD   Chief Complaint  Patient presents with  . Palpitations      History of Present Illness: Heather Leblanc is a 64 y.o. female who presents for evaluation of palpitations. She thinks that this started sometime last year when her dose of Xanax was reduced. She said she was started on some Celexa but she didn't tolerate this. She said she had severe panic and palpitations. She said she was so lightheaded she had trouble getting up. She denied any chest pressure, neck or arm discomfort. She was not having any new shortness of breath, PND or orthopnea. She's not had any weight gain or edema. She's not had any prior cardiac history. Unfortunately she has a history of psychiatric illness but currently transportation and cost limit her access to psychiatric care. She says she was homeless for a while but now has housing and a support group which helps. She reports she is very anxious today.   Past Medical History  Diagnosis Date  . Hypothyroidism   . Anxiety   . Eczema   . Cystitis   . Premature menopause   . History of phlebitis   . 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 History of seasonal allergies   . Grand mal seizure     Hx  (1)  . Depression     Past Surgical History  Procedure Laterality Date  . Right leg vein surgery       Current Outpatient Prescriptions  Medication Sig Dispense Refill  . alprazolam (XANAX) 2 MG tablet TAKE  (1)  TABLET TWICE A DAY. 60 tablet 1  . Cholecalciferol (VITAMIN D) 2000 UNITS CAPS Take 2,000 Units by mouth daily.     . furosemide (LASIX) 20 MG tablet Take 1 tablet (20 mg total) by mouth daily. (Patient taking differently: Take 20 mg by mouth as needed for fluid or edema. ) 30 tablet 3  . ibuprofen (ADVIL,MOTRIN) 600 MG tablet Take 1 tablet (600 mg total) by mouth every 8 (eight) hours as needed.  30 tablet 1  . levothyroxine (SYNTHROID, LEVOTHROID) 100 MCG tablet Take 1 tablet (100 mcg total) by mouth daily. 30 tablet 1  . Omega-3 Fatty Acids (FISH OIL PO) Take 1,000 mg by mouth 3 (three) times daily. 2 caos in am, 1 cap in pm    . atorvastatin (LIPITOR) 20 MG tablet Take 1 tablet (20 mg total) by mouth daily. (Patient not taking: Reported on 07/10/2014) 90 tablet 3  . escitalopram (LEXAPRO) 20 MG tablet Take 1 tablet (20 mg total) by mouth daily. (Patient not taking: Reported on 07/10/2014) 30 tablet 2   No current facility-administered medications for this visit.    Allergies:   Celexa; Iohexol; Ivp dye; Phenobarbital; Shellfish allergy; Ciprofloxacin; Tegretol; Cinobac; and Penicillins    Social History:  The patient  reports that she has never smoked. She has never used smokeless tobacco. She reports that she does not drink alcohol or use illicit drugs.   Family History:  The patient's family history includes CAD (age of onset: 57) in her father; Heart disease in her mother.    ROS:  Please see the history of present illness.   Otherwise, review of systems are positive for dizziness, occasional leg cramps edema..   All other systems are reviewed and negative.  PHYSICAL EXAM: VS:  BP 98/70 mmHg  Pulse 75  Ht 5\' 6"  (1.676 m)  Wt 161 lb (73.029 kg)  BMI 26.00 kg/m2 , BMI Body mass index is 26 kg/(m^2). GENERAL:  Well appearing HEENT:  Pupils equal round and reactive, fundi not visualized, oral mucosa unremarkable NECK:  No jugular venous distention, waveform within normal limits, carotid upstroke brisk and symmetric, no bruits, no thyromegaly LYMPHATICS:  No cervical, inguinal adenopathy LUNGS:  Clear to auscultation bilaterally BACK:  No CVA tenderness CHEST:  Unremarkable HEART:  PMI not displaced or sustained,S1 and S2 within normal limits, no S3, no S4, no clicks, no rubs, no murmurs ABD:  Flat, positive bowel sounds normal in frequency in pitch, no bruits, no rebound,  no guarding, no midline pulsatile mass, no hepatomegaly, no splenomegaly EXT:  2 plus pulses throughout, no edema, no cyanosis no clubbing SKIN:  No rashes no nodules NEURO:  Cranial nerves II through XII grossly intact, motor grossly intact throughout PSYCH:  Cognitively intact, oriented to person place and time    EKG:  EKG is ordered today. The ekg ordered today demonstrates sinus rhythm, rate 75, axis within normal limits, intervals within normal limits, poor anterior R wave progression, no acute ST-T wave changes.  07/10/2014   Recent Labs: 06/17/2014: BUN 9; Creatinine 0.59; Hemoglobin 12.8; Platelets 222; Potassium 4.4; Sodium 139; TSH 0.570    Lipid Panel    Component Value Date/Time   CHOL 262* 06/28/2014 1712   TRIG 140 06/28/2014 1712   HDL 69 06/28/2014 1712   CHOLHDL 3.8 06/28/2014 1712   LDLCALC 165* 06/28/2014 1712      Wt Readings from Last 3 Encounters:  07/10/14 161 lb (73.029 kg)  06/28/14 165 lb (74.844 kg)  06/17/14 165 lb (74.844 kg)      Other studies Reviewed: Additional studies/ records that were reviewed today include: primary care office records. Review of the above records demonstrates:  Please see elsewhere in the note.     ASSESSMENT AND PLAN:  PALPITATIONS:  I suspect this is more likely related to anxiety. However, I will start with an echocardiogram and Holter monitor. She has had a normal TSH and electrolytes recently. For now I will not change her therapy.  ANXIETY:  I sent a note to her primary care. I think this patient greatly benefit from caseworker to help with transportation. She needs a psychiatry consultation for medication management.   Current medicines are reviewed at length with the patient today.  The patient does not have concerns regarding medicines.  The following changes have been made:  no change  Labs/ tests ordered today include: Event monitor and echo   Orders Placed This Encounter  Procedures  . EKG 12-Lead    . Holter monitor - 24 hour  . 2D Echocardiogram with contrast     Disposition:   FU with me after the event monitor.      Signed, Minus Breeding, MD  07/10/2014 4:58 PM    [facility]

## 2014-07-10 NOTE — Telephone Encounter (Signed)
Called into [employer].

## 2014-07-10 NOTE — Patient Instructions (Addendum)
The current medical regimen is effective;  continue present plan and medications.  Your physician has recommended that you wear a holter monitor.  This will be placed at [facility] today.   Holter monitors are medical devices that record the heart's electrical activity. Doctors most often use these monitors to diagnose arrhythmias. Arrhythmias are problems with the speed or rhythm of the heartbeat. The monitor is a small, portable device. You can wear one while you do your normal daily activities. This is usually used to diagnose what is causing palpitations/syncope (passing out).  Your physician has requested that you have an echocardiogram at [facility].  Please check in at 2:30 at [place].  Echocardiography is a painless test that uses sound waves to create images of your heart. It provides your doctor with information about the size and shape of your heart and how well your heart's chambers and valves are working. This procedure takes approximately one hour. There are no restrictions for this procedure.  Follow up will be based on these results.  Thank you for choosing [facility] [facility]!!

## 2014-07-11 ENCOUNTER — Ambulatory Visit (HOSPITAL_COMMUNITY)
Admission: RE | Admit: 2014-07-11 | Discharge: 2014-07-11 | Disposition: A | Discharge status: Home / Self Care | Payer: 59 | Source: Ambulatory Visit | Attending: Cardiology | Admitting: Cardiology

## 2014-07-11 DIAGNOSIS — R079 Chest pain, unspecified: Secondary | ICD-10-CM | POA: Diagnosis not present

## 2014-07-11 DIAGNOSIS — R002 Palpitations: Secondary | ICD-10-CM

## 2014-07-11 NOTE — Progress Notes (Signed)
  Echocardiogram 2D Echocardiogram has been performed.  Samuel Germany 07/11/2014, 3:49 PM

## 2014-07-17 ENCOUNTER — Telehealth: Payer: Self-pay | Admitting: Family Medicine

## 2014-07-17 NOTE — Telephone Encounter (Signed)
Patient aware that heat monitor results will be downloaded and sent to [facility] to review.

## 2014-07-22 ENCOUNTER — Telehealth: Payer: Self-pay | Admitting: Cardiology

## 2014-07-22 NOTE — Telephone Encounter (Signed)
Reviewed results of 2 D Echo with pt how states understanding.  Pt is aware we do not have results of 24 hr holter monitor at this point.

## 2014-07-22 NOTE — Telephone Encounter (Signed)
New message  ° ° °Patient calling for test results.   °

## 2014-07-23 ENCOUNTER — Telehealth: Payer: Self-pay | Admitting: Family Medicine

## 2014-07-23 ENCOUNTER — Other Ambulatory Visit: Payer: Self-pay | Admitting: *Deleted

## 2014-07-23 DIAGNOSIS — R002 Palpitations: Secondary | ICD-10-CM

## 2014-07-24 ENCOUNTER — Ambulatory Visit: Payer: PRIVATE HEALTH INSURANCE | Admitting: Cardiology

## 2014-07-24 NOTE — Telephone Encounter (Signed)
Reviewed results of holter monitor with pt who states understanding.

## 2014-07-24 NOTE — Telephone Encounter (Signed)
Patient advised to call [facility] office for results

## 2014-08-13 ENCOUNTER — Telehealth: Payer: Self-pay | Admitting: Family Medicine

## 2014-08-13 NOTE — Telephone Encounter (Signed)
Please address

## 2014-08-14 ENCOUNTER — Ambulatory Visit (INDEPENDENT_AMBULATORY_CARE_PROVIDER_SITE_OTHER): Payer: 59 | Admitting: Family

## 2014-08-14 ENCOUNTER — Encounter: Payer: Self-pay | Admitting: Family

## 2014-08-14 VITALS — BP 131/88 | HR 99 | Temp 97.1°F | Wt 155.5 lb

## 2014-08-14 DIAGNOSIS — F411 Generalized anxiety disorder: Secondary | ICD-10-CM | POA: Diagnosis not present

## 2014-08-14 DIAGNOSIS — H8110 Benign paroxysmal vertigo, unspecified ear: Secondary | ICD-10-CM | POA: Diagnosis not present

## 2014-08-14 LAB — POCT CBC
Granulocyte percent: 73.4 %G (ref 37–80)
HCT, POC: 41.3 % (ref 37.7–47.9)
Hemoglobin: 13.3 g/dL (ref 12.2–16.2)
LYMPH, POC: 2.3 (ref 0.6–3.4)
MCH, POC: 28.5 pg (ref 27–31.2)
MCHC: 32.2 g/dL (ref 31.8–35.4)
MCV: 88.6 fL (ref 80–97)
MPV: 7.9 fL (ref 0–99.8)
POC GRANULOCYTE: 8.2 — AB (ref 2–6.9)
POC LYMPH %: 20.8 % (ref 10–50)
Platelet Count, POC: 231 10*3/uL (ref 142–424)
RBC: 4.66 M/uL (ref 4.04–5.48)
RDW, POC: 13.9 %
WBC: 11.2 10*3/uL — AB (ref 4.6–10.2)

## 2014-08-14 MED ORDER — MECLIZINE HCL 50 MG PO TABS: 50.0000 mg | ORAL_TABLET | Freq: Three times a day (TID) | ORAL | Status: DC | PRN

## 2014-08-14 NOTE — Patient Instructions (Signed)
Generalized Anxiety Disorder Generalized anxiety disorder (GAD) is a mental disorder. It interferes with life functions, including relationships, work, and school. GAD is different from normal anxiety, which everyone experiences at some point in their lives in response to specific life events and activities. Normal anxiety actually helps [place] prepare for and get through these life events and activities. Normal anxiety goes away after the event or activity is over.  GAD causes anxiety that is not necessarily related to specific events or activities. It also causes excess anxiety in proportion to specific events or activities. The anxiety associated with GAD is also difficult to control. GAD can vary from mild to severe. People with severe GAD can have intense waves of anxiety with physical symptoms (panic attacks).  SYMPTOMS The anxiety and worry associated with GAD are difficult to control. This anxiety and worry are related to many life events and activities and also occur more days than not for 6 months or longer. People with GAD also have three or more of the following symptoms (one or more in children):  Restlessness.   Fatigue.  Difficulty concentrating.   Irritability.  Muscle tension.  Difficulty sleeping or unsatisfying sleep. DIAGNOSIS GAD is diagnosed through an assessment by your health care provider. Your health care provider will ask you questions aboutyour mood,physical symptoms, and events in your life. Your health care provider may ask you about your medical history and use of alcohol or drugs, including prescription medicines. Your health care provider may also do a physical exam and blood tests. Certain medical conditions and the use of certain substances can cause symptoms similar to those associated with GAD. Your health care provider may refer you to a mental health specialist for further evaluation. TREATMENT The following therapies are usually used to treat GAD:    Medication. Antidepressant medication usually is prescribed for long-term daily control. Antianxiety medicines may be added in severe cases, especially when panic attacks occur.   Talk therapy (psychotherapy). Certain types of talk therapy can be helpful in treating GAD by providing support, education, and guidance. A form of talk therapy called cognitive behavioral therapy can teach you healthy ways to think about and react to daily life events and activities.  Stress managementtechniques. These include yoga, meditation, and exercise and can be very helpful when they are practiced regularly. A mental health specialist can help determine which treatment is best for you. Some people see improvement with one therapy. However, other people require a combination of therapies. Document Released: 07/24/2012 Document Revised: 08/13/2013 Document Reviewed: 07/24/2012 [facility] Patient Information 2015 [place], [place]. This information is not intended to replace advice given to you by your health care provider. Make sure you discuss any questions you have with your health care provider. Dizziness Dizziness is a common problem. It is a feeling of unsteadiness or light-headedness. You may feel like you are about to faint. Dizziness can lead to injury if you stumble or fall. A person of any age group can suffer from dizziness, but dizziness is more common in older adults. CAUSES  Dizziness can be caused by many different things, including:  Middle ear problems.  Standing for too long.  Infections.  An allergic reaction.  Aging.  An emotional response to something, such as the sight of blood.  Side effects of medicines.  Tiredness.  Problems with circulation or blood pressure.  Excessive use of alcohol or medicines, or illegal drug use.  Breathing too fast (hyperventilation).  An irregular heart rhythm (arrhythmia).  A low  red blood cell count (anemia).  Pregnancy.  Vomiting,  diarrhea, fever, or other illnesses that cause body fluid loss (dehydration).  Diseases or conditions such as Parkinson's disease, high blood pressure (hypertension), diabetes, and thyroid problems.  Exposure to extreme heat. DIAGNOSIS  Your health care provider will ask about your symptoms, perform a physical exam, and perform an electrocardiogram (ECG) to record the electrical activity of your heart. Your health care provider may also perform other heart or blood tests to determine the cause of your dizziness. These may include:  Transthoracic echocardiogram (TTE). During echocardiography, sound waves are used to evaluate how blood flows through your heart.  Transesophageal echocardiogram (TEE).  Cardiac monitoring. This allows your health care provider to monitor your heart rate and rhythm in real time.  Holter monitor. This is a portable device that records your heartbeat and can help diagnose heart arrhythmias. It allows your health care provider to track your heart activity for several days if needed.  Stress tests by exercise or by giving medicine that makes the heart beat faster. TREATMENT  Treatment of dizziness depends on the cause of your symptoms and can vary greatly. HOME CARE INSTRUCTIONS   Drink enough fluids to keep your urine clear or pale yellow. This is especially important in very hot weather. In older adults, it is also important in cold weather.  Take your medicine exactly as directed if your dizziness is caused by medicines. When taking blood pressure medicines, it is especially important to get up slowly.  Rise slowly from chairs and steady yourself until you feel okay.  In the morning, first sit up on the side of the bed. When you feel okay, stand slowly while holding onto something until you know your balance is fine.  Move your legs often if you need to stand in one place for a long time. Tighten and relax your muscles in your legs while standing.  Have  someone stay with you for 1-2 days if dizziness continues to be a problem. Do this until you feel you are well enough to stay alone. Have the person call your health care provider if he or she notices changes in you that are concerning.  Do not drive or use heavy machinery if you feel dizzy.  Do not drink alcohol. SEEK IMMEDIATE MEDICAL CARE IF:   Your dizziness or light-headedness gets worse.  You feel nauseous or vomit.  You have problems talking, walking, or using your arms, hands, or legs.  You feel weak.  You are not thinking clearly or you have trouble forming sentences. It may take a friend or family member to notice this.  You have chest pain, abdominal pain, shortness of breath, or sweating.  Your vision changes.  You notice any bleeding.  You have side effects from medicine that seems to be getting worse rather than better. MAKE SURE YOU:   Understand these instructions.  Will watch your condition.  Will get help right away if you are not doing well or get worse. Document Released: 09/22/2000 Document Revised: 04/03/2013 Document Reviewed: 10/16/2010 [facility] Patient Information 2015 [place], [place]. This information is not intended to replace advice given to you by your health care provider. Make sure you discuss any questions you have with your health care provider.

## 2014-08-14 NOTE — Progress Notes (Signed)
   Subjective:    Patient ID: Heather Leblanc, female    DOB: 07/15/1950, 64 y.o.   MRN: [id number]  Dizziness This is a new problem. The current episode started in the past 7 days. The problem occurs intermittently. Pertinent negatives include no chills, coughing, headaches or numbness. The treatment provided mild relief.  Anxiety Presents for follow-up visit. Symptoms include dizziness, excessive worry, irritability, nervous/anxious behavior, obsessions, palpitations and panic. Patient reports no shortness of breath. Symptoms occur constantly. The severity of symptoms is interfering with daily activities. The symptoms are aggravated by specific phobias and family issues.   Her past medical history is significant for anxiety/panic attacks and depression. Past treatments include benzodiazephines. The treatment provided mild relief. Compliance with prior treatments has been good.      Review of Systems  Constitutional: Positive for irritability. Negative for chills.  HENT: Negative.   Eyes: Negative.   Respiratory: Negative.  Negative for cough and shortness of breath.   Cardiovascular: Positive for palpitations.  Gastrointestinal: Negative.   Endocrine: Negative.   Genitourinary: Negative.   Musculoskeletal: Negative.   Neurological: Positive for dizziness. Negative for numbness and headaches.  Hematological: Negative.   Psychiatric/Behavioral: The patient is nervous/anxious.   All other systems reviewed and are negative.      Objective:   Physical Exam  Constitutional: She is oriented to person, place, and time. She appears well-developed and well-nourished. No distress.  HENT:  Head: Normocephalic and atraumatic.  Right Ear: External ear normal.  Left Ear: External ear normal.  Nose: Nose normal.  Mouth/Throat: Oropharynx is clear and moist.  Eyes: Pupils are equal, round, and reactive to light.  Neck: Normal range of motion. Neck supple. No thyromegaly present.    Cardiovascular: Normal rate, regular rhythm, normal heart sounds and intact distal pulses.   No murmur heard. Pulmonary/Chest: Effort normal and breath sounds normal. No respiratory distress. She has no wheezes.  Abdominal: Soft. Bowel sounds are normal. She exhibits no distension. There is no tenderness.  Musculoskeletal: Normal range of motion. She exhibits no edema or tenderness.  Neurological: She is alert and oriented to person, place, and time. She has normal reflexes. No cranial nerve deficit.  Skin: Skin is warm and dry.  Psychiatric: She has a normal mood and affect. Her behavior is normal. Judgment and thought content normal.  Vitals reviewed.     BP 131/88 mmHg  Pulse 99  Temp(Src) 97.1 F (36.2 C) (Oral)  Wt 155 lb 8 oz (70.534 kg)     Assessment & Plan:  1. BPPV (benign paroxysmal positional vertigo), unspecified laterality [place] precaution discussed -Different exercises discussed - POCT CBC - BMP8+EGFR - meclizine (ANTIVERT) 50 MG tablet; Take 1 tablet (50 mg total) by mouth 3 (three) times daily as needed.  Dispense: 45 tablet; Refill: 2  2. GAD (generalized anxiety disorder) -Stress management -Pt to start her lexapro- Discussed in length possible adverse effects discussed -Spent 30 mins discussing anxiety issues  - BMP8+EGFR   Continue all meds Labs pending Diet and exercise encouraged RTO as needed and keep all follow up appts with Dr. Eliseo Squires, FNP

## 2014-08-14 NOTE — Telephone Encounter (Signed)
Pt is c/o dizzy spells and anxiety, given appt this afternoon with Evelina Dun at 2:40.

## 2014-08-15 LAB — BMP8+EGFR
BUN/Creatinine Ratio: 14 (ref 11–26)
BUN: 8 mg/dL (ref 8–27)
CO2: 28 mmol/L (ref 18–29)
Calcium: 9.7 mg/dL (ref 8.7–10.3)
Chloride: 100 mmol/L (ref 97–108)
Creatinine, Ser: 0.56 mg/dL — ABNORMAL LOW (ref 0.57–1.00)
GFR, EST AFRICAN AMERICAN: 114 mL/min/{1.73_m2} (ref >59)
GFR, EST NON AFRICAN AMERICAN: 99 mL/min/{1.73_m2} (ref >59)
GLUCOSE: 100 mg/dL — AB (ref 65–99)
POTASSIUM: 4.7 mmol/L (ref 3.5–5.2)
SODIUM: 141 mmol/L (ref 134–144)

## 2014-08-28 ENCOUNTER — Ambulatory Visit (INDEPENDENT_AMBULATORY_CARE_PROVIDER_SITE_OTHER): Payer: 59 | Admitting: Family Medicine

## 2014-08-28 ENCOUNTER — Encounter: Payer: Self-pay | Admitting: Family Medicine

## 2014-08-28 VITALS — BP 146/88 | HR 93 | Temp 98.4°F | Ht 66.0 in | Wt 156.0 lb

## 2014-08-28 DIAGNOSIS — R600 Localized edema: Secondary | ICD-10-CM

## 2014-08-28 DIAGNOSIS — R609 Edema, unspecified: Secondary | ICD-10-CM | POA: Diagnosis not present

## 2014-08-28 DIAGNOSIS — R002 Palpitations: Secondary | ICD-10-CM | POA: Diagnosis not present

## 2014-08-28 DIAGNOSIS — E039 Hypothyroidism, unspecified: Secondary | ICD-10-CM | POA: Diagnosis not present

## 2014-08-28 MED ORDER — ALPRAZOLAM 2 MG PO TABS: 2.0000 mg | ORAL_TABLET | Freq: Two times a day (BID) | ORAL | Status: DC

## 2014-08-28 MED ORDER — FUROSEMIDE 20 MG PO TABS: 20.0000 mg | ORAL_TABLET | ORAL | Status: DC | PRN

## 2014-08-28 NOTE — Progress Notes (Addendum)
   Subjective:    Patient ID: Heather Leblanc, female    DOB: August 09, 1950, 64 y.o.   MRN: [id number]  HPI    Review of Systems     Objective:   Physical Exam          2. Palpitation Reassure her with negative Holter test that palpitations are not lethal and to try and go on about her business knowing that there is not a serious problem.  3. Hypothyroidism, unspecified hypothyroidism type Had adjusted her dose of thyroid replacement 2 months ago. Need to check TSH today to assess replacement  Wardell Honour MD

## 2014-08-28 NOTE — Progress Notes (Signed)
Subjective:    Patient ID: Heather Leblanc, female    DOB: Feb 21, 1951, 64 y.o.   MRN: [id number]  HPI 64 year old female here for follow-up palpitations and anxiety. Since her last visit here she wore a Holter monitor for 24 hours and this was considered normal exam by her cardiologist. She had one episode where she was afraid to go out of a building but that is not typical. She continues with alprazolam 2 mg twice a day and that seems to control her symptoms. She did start on Lexapro 10 mg but does not really want to take that since she has an allergy to citalopram  She has not started her atorvastatin yet I've asked her to go ahead and start 20 mg and take it every other day and we will check her lipids in 2 months.  Patient Active Problem List   Diagnosis Date Noted  . Palpitation 07/10/2014  . Urinary tract infectious disease 01/31/2014  . Hydradenitis 01/31/2014  . Peripheral edema 12/03/2013  . White coat syndrome without hypertension 12/03/2013  . Hypothyroidism 12/08/2012  . Seizures 12/08/2012  . GAD (generalized anxiety disorder) 12/08/2012   Outpatient Encounter Prescriptions as of 08/28/2014  Medication Sig  . alprazolam (XANAX) 2 MG tablet TAKE  (1)  TABLET TWICE A DAY.  Marland Kitchen Cholecalciferol (VITAMIN D) 2000 UNITS CAPS Take 2,000 Units by mouth daily.   Marland Kitchen escitalopram (LEXAPRO) 20 MG tablet Take 1 tablet (20 mg total) by mouth daily. (Patient taking differently: Take 10 mg by mouth daily. )  . furosemide (LASIX) 20 MG tablet Take 1 tablet (20 mg total) by mouth daily. (Patient taking differently: Take 20 mg by mouth as needed for fluid or edema. )  . ibuprofen (ADVIL,MOTRIN) 600 MG tablet Take 1 tablet (600 mg total) by mouth every 8 (eight) hours as needed.  Marland Kitchen levothyroxine (SYNTHROID, LEVOTHROID) 100 MCG tablet Take 1 tablet (100 mcg total) by mouth daily.  . meclizine (ANTIVERT) 50 MG tablet Take 1 tablet (50 mg total) by mouth 3 (three) times daily as needed.  . Omega-3 Fatty  Acids (FISH OIL PO) Take 1,000 mg by mouth 3 (three) times daily. 2 caos in am, 1 cap in pm  . atorvastatin (LIPITOR) 20 MG tablet Take 1 tablet (20 mg total) by mouth daily. (Patient not taking: Reported on 07/10/2014)   No facility-administered encounter medications on file as of 08/28/2014.      Review of Systems  HENT: Negative.   Respiratory: Negative.   Cardiovascular: Negative.   Musculoskeletal: Negative.   Skin: Negative.   Neurological: Negative.   Psychiatric/Behavioral: Negative.        Objective:   Physical Exam  Constitutional: She is oriented to person, place, and time. She appears well-developed and well-nourished.  HENT:  Head: Normocephalic.  Neck: Normal range of motion.  Cardiovascular: Normal rate and regular rhythm.   Pulmonary/Chest: Effort normal and breath sounds normal.  Musculoskeletal: Normal range of motion.  Neurological: She is alert and oriented to person, place, and time.  Psychiatric: She has a normal mood and affect.    BP 146/88 mmHg  Pulse 93  Temp(Src) 98.4 F (36.9 C) (Oral)  Ht 5\' 6"  (1.676 m)  Wt 156 lb (70.761 kg)  BMI 25.19 kg/m2       Assessment & Plan:  1. Peripheral edema Symptoms controlled with when necessary Lasix - furosemide (LASIX) 20 MG tablet; Take 1 tablet (20 mg total) by mouth as needed for fluid or edema.  Dispense: 30 tablet; Refill: 3 - TSH

## 2014-08-29 ENCOUNTER — Telehealth: Payer: Self-pay | Admitting: *Deleted

## 2014-08-29 LAB — TSH: TSH: 1.33 u[IU]/mL (ref 0.450–4.500)

## 2014-08-29 NOTE — Telephone Encounter (Signed)
-----   Message from Wardell Honour, MD sent at 08/29/2014  7:57 AM EDT ----- TSH is in good range. No changes are recommended and thyroid replacement. Check again in one year

## 2014-08-29 NOTE — Telephone Encounter (Signed)
Pt notified of results Verbalizes understanding 

## 2014-09-11 ENCOUNTER — Other Ambulatory Visit: Payer: Self-pay | Admitting: Family Medicine

## 2014-10-07 ENCOUNTER — Telehealth: Payer: Self-pay | Admitting: Family Medicine

## 2014-10-08 NOTE — Telephone Encounter (Signed)
Recurrent abd bloating. She denies pain or irregular bowel movements.  Appt scheduled with Dr Sabra Heck for 10/16/14. Patient aware. She will call back if symptoms worsen.

## 2014-10-16 ENCOUNTER — Ambulatory Visit (INDEPENDENT_AMBULATORY_CARE_PROVIDER_SITE_OTHER): Payer: 59 | Admitting: Family Medicine

## 2014-10-16 ENCOUNTER — Ambulatory Visit (HOSPITAL_COMMUNITY): Admission: RE | Admit: 2014-10-16 | Payer: 59 | Source: Ambulatory Visit

## 2014-10-16 ENCOUNTER — Ambulatory Visit (HOSPITAL_COMMUNITY)
Admission: RE | Admit: 2014-10-16 | Discharge: 2014-10-16 | Disposition: A | Discharge status: Home / Self Care | Payer: 59 | Source: Ambulatory Visit | Attending: Family Medicine | Admitting: Family Medicine

## 2014-10-16 ENCOUNTER — Other Ambulatory Visit: Payer: Self-pay | Admitting: Family Medicine

## 2014-10-16 ENCOUNTER — Encounter: Payer: Self-pay | Admitting: Family Medicine

## 2014-10-16 ENCOUNTER — Ambulatory Visit (HOSPITAL_COMMUNITY): Payer: 59

## 2014-10-16 VITALS — BP 138/81 | HR 98 | Temp 98.7°F | Ht 66.0 in | Wt 179.0 lb

## 2014-10-16 DIAGNOSIS — R932 Abnormal findings on diagnostic imaging of liver and biliary tract: Secondary | ICD-10-CM | POA: Diagnosis not present

## 2014-10-16 DIAGNOSIS — R140 Abdominal distension (gaseous): Secondary | ICD-10-CM | POA: Diagnosis not present

## 2014-10-16 DIAGNOSIS — D229 Melanocytic nevi, unspecified: Secondary | ICD-10-CM | POA: Diagnosis not present

## 2014-10-16 DIAGNOSIS — R635 Abnormal weight gain: Secondary | ICD-10-CM | POA: Diagnosis not present

## 2014-10-16 DIAGNOSIS — R609 Edema, unspecified: Secondary | ICD-10-CM | POA: Diagnosis not present

## 2014-10-16 DIAGNOSIS — E039 Hypothyroidism, unspecified: Secondary | ICD-10-CM

## 2014-10-16 DIAGNOSIS — F411 Generalized anxiety disorder: Secondary | ICD-10-CM

## 2014-10-16 NOTE — Progress Notes (Signed)
   Subjective:    Patient ID: Heather Leblanc, female    DOB: 11-17-1950, 64 y.o.   MRN: [id number]  HPI  64 year old female with chief complaint of bloating, abdominal distention, and weight gain. We have documented weight gain of 23 pounds in the past 6 weeks. She denies any pain, change in bowel habits, but does have chronic constipation for which she takes occasional laxity of, there is no anorexia, no GERD, and no GYN symptoms such as spotting or bleeding.    Review of Systems  Constitutional: Negative.   Respiratory: Negative.   Cardiovascular: Positive for palpitations.  Gastrointestinal: Positive for abdominal distention.  Genitourinary: Negative.    Patient Active Problem List   Diagnosis Date Noted  . Palpitation 07/10/2014  . Urinary tract infectious disease 01/31/2014  . Hydradenitis 01/31/2014  . Peripheral edema 12/03/2013  . White coat syndrome without hypertension 12/03/2013  . Hypothyroidism 12/08/2012  . GAD (generalized anxiety disorder) 12/08/2012   Outpatient Encounter Prescriptions as of 10/16/2014  Medication Sig  . alprazolam (XANAX) 2 MG tablet Take 1 tablet (2 mg total) by mouth 2 (two) times daily.  Marland Kitchen atorvastatin (LIPITOR) 20 MG tablet Take 1 tablet (20 mg total) by mouth daily. (Patient taking differently: Take 20 mg by mouth daily. Take every other day)  . Cholecalciferol (VITAMIN D) 2000 UNITS CAPS Take 2,000 Units by mouth daily.   . furosemide (LASIX) 20 MG tablet Take 1 tablet (20 mg total) by mouth as needed for fluid or edema.  Marland Kitchen ibuprofen (ADVIL,MOTRIN) 600 MG tablet Take 1 tablet (600 mg total) by mouth every 8 (eight) hours as needed.  Marland Kitchen levothyroxine (SYNTHROID, LEVOTHROID) 100 MCG tablet TAKE ONE TABLET BY MOUTH ONCE DAILY  . meclizine (ANTIVERT) 50 MG tablet Take 1 tablet (50 mg total) by mouth 3 (three) times daily as needed.  . Omega-3 Fatty Acids (FISH OIL PO) Take 1,000 mg by mouth 3 (three) times daily. 2 caos in am, 1 cap in pm  .  [DISCONTINUED] escitalopram (LEXAPRO) 20 MG tablet Take 1 tablet (20 mg total) by mouth daily. (Patient taking differently: Take 10 mg by mouth daily. )   No facility-administered encounter medications on file as of 10/16/2014.       Objective:   Physical Exam  Constitutional: She appears well-developed and well-nourished.  Cardiovascular: Normal rate and regular rhythm.   Abdominal: Soft. Bowel sounds are normal. She exhibits distension. There is no tenderness. There is no rebound and no guarding.  Skin:  Patient has a dark lesion on her left anterior shin which she says has changed. It has been seen before by her dermatologist who declined biopsy. Shave excision was carried out using 1% lidocaine excision and hyfrecation. Specimen will be sent for analysis          Assessment & Plan:  1. Peripheral edema Unchanged and no worse  2. Hypothyroidism, unspecified hypothyroidism type TSH was checked 6 weeks ago and was normal  3. GAD (generalized anxiety disorder) Symptoms are controlled with Xanax although she still reports some palpitations   4. Abdominal bloating Bloating, abdominal distention and weight gain are worrisome. GYN pathology is leading candidate. We'll send for pelvic and abdominal ultrasound.  Wardell Honour MD

## 2014-10-17 ENCOUNTER — Telehealth: Payer: Self-pay | Admitting: Family Medicine

## 2014-10-17 DIAGNOSIS — N939 Abnormal uterine and vaginal bleeding, unspecified: Secondary | ICD-10-CM

## 2014-10-17 DIAGNOSIS — R9389 Abnormal findings on diagnostic imaging of other specified body structures: Secondary | ICD-10-CM

## 2014-10-17 NOTE — Telephone Encounter (Signed)
-----   Message from Wardell Honour, MD sent at 10/17/2014  7:43 AM EDT ----- Ultrasound, abdomen is negative but radiologist recommends CT given her history

## 2014-10-17 NOTE — Telephone Encounter (Signed)
Pt notified of results Verbalizes understanding Orders entered in Epic

## 2014-10-18 ENCOUNTER — Other Ambulatory Visit (HOSPITAL_COMMUNITY): Payer: 59

## 2014-10-21 ENCOUNTER — Telehealth: Payer: Self-pay | Admitting: Family Medicine

## 2014-10-21 ENCOUNTER — Ambulatory Visit: Payer: Self-pay | Admitting: Nurse Practitioner

## 2014-10-21 LAB — PATHOLOGY

## 2014-10-22 ENCOUNTER — Ambulatory Visit (INDEPENDENT_AMBULATORY_CARE_PROVIDER_SITE_OTHER): Payer: 59 | Admitting: Family

## 2014-10-22 ENCOUNTER — Encounter: Payer: Self-pay | Admitting: Family

## 2014-10-22 ENCOUNTER — Telehealth: Payer: Self-pay | Admitting: Family Medicine

## 2014-10-22 VITALS — BP 147/80 | HR 101 | Temp 97.8°F | Ht 66.0 in | Wt 182.0 lb

## 2014-10-22 DIAGNOSIS — L03116 Cellulitis of left lower limb: Secondary | ICD-10-CM

## 2014-10-22 DIAGNOSIS — L089 Local infection of the skin and subcutaneous tissue, unspecified: Secondary | ICD-10-CM | POA: Diagnosis not present

## 2014-10-22 MED ORDER — MUPIROCIN 2 % EX OINT: 1.0000 "application " | TOPICAL_OINTMENT | Freq: Two times a day (BID) | CUTANEOUS | Status: DC

## 2014-10-22 MED ORDER — CEPHALEXIN 500 MG PO CAPS: 500.0000 mg | ORAL_CAPSULE | Freq: Three times a day (TID) | ORAL | Status: DC

## 2014-10-22 NOTE — Progress Notes (Signed)
   Subjective:    Patient ID: Heather Leblanc, female    DOB: 01-14-1951, 64 y.o.   MRN: [id number]  HPI PT presents to the office today for erythemas lesion on her left shin. Pt had mole removed on shin on 10/16/14. Pt states since removing it has become erythemas with bloody discharge. Pt states she has been applying neosporin.   Review of Systems  Constitutional: Negative.   HENT: Negative.   Eyes: Negative.   Respiratory: Negative.  Negative for shortness of breath.   Cardiovascular: Negative.  Negative for palpitations.  Gastrointestinal: Negative.   Endocrine: Negative.   Genitourinary: Negative.   Musculoskeletal: Negative.   Neurological: Negative.  Negative for headaches.  Hematological: Negative.   Psychiatric/Behavioral: Negative.   All other systems reviewed and are negative.      Objective:   Physical Exam  Constitutional: She is oriented to person, place, and time. She appears well-developed and well-nourished. No distress.  HENT:  Head: Normocephalic and atraumatic.  Eyes: Pupils are equal, round, and reactive to light.  Neck: Normal range of motion. Neck supple. No thyromegaly present.  Cardiovascular: Normal rate, regular rhythm, normal heart sounds and intact distal pulses.   No murmur heard. Pulmonary/Chest: Effort normal and breath sounds normal. No respiratory distress. She has no wheezes.  Abdominal: Soft. Bowel sounds are normal. She exhibits no distension. There is no tenderness.  Musculoskeletal: Normal range of motion. She exhibits no edema or tenderness.  Neurological: She is alert and oriented to person, place, and time. She has normal reflexes. No cranial nerve deficit.  Skin: Skin is warm and dry. There is erythema.  Three erythems lesions on left shin with yellow crusted  Psychiatric: She has a normal mood and affect. Her behavior is normal. Judgment and thought content normal.  Vitals reviewed.   BP 147/80 mmHg  Pulse 101  Temp(Src) 97.8 F  (36.6 C) (Oral)  Ht 5\' 6"  (1.676 m)  Wt 182 lb (82.555 kg)  BMI 29.39 kg/m2       Assessment & Plan:  1. Skin infection - Aerobic culture - mupirocin ointment (BACTROBAN) 2 %; Apply 1 application topically 2 (two) times daily.  Dispense: 22 g; Refill: 0 - cephALEXin (KEFLEX) 500 MG capsule; Take 1 capsule (500 mg total) by mouth 3 (three) times daily.  Dispense: 21 capsule; Refill: 0  2. Cellulitis of left lower extremity - mupirocin ointment (BACTROBAN) 2 %; Apply 1 application topically 2 (two) times daily.  Dispense: 22 g; Refill: 0 - cephALEXin (KEFLEX) 500 MG capsule; Take 1 capsule (500 mg total) by mouth 3 (three) times daily.  Dispense: 21 capsule; Refill: 0  Keep leg elevated when possible Continue lasix as needed Report any changes in the redness, swelling, or drainage  RTO in 2 weeks  Evelina Dun, FNP

## 2014-10-22 NOTE — Patient Instructions (Signed)

## 2014-10-22 NOTE — Telephone Encounter (Signed)
Dr. Sabra Heck please address

## 2014-10-24 ENCOUNTER — Ambulatory Visit (HOSPITAL_COMMUNITY)
Admission: RE | Admit: 2014-10-24 | Discharge: 2014-10-24 | Disposition: A | Discharge status: Home / Self Care | Payer: 59 | Source: Ambulatory Visit | Attending: Family Medicine | Admitting: Family Medicine

## 2014-10-24 ENCOUNTER — Encounter: Payer: 59 | Admitting: Obstetrics & Gynecology

## 2014-10-24 ENCOUNTER — Ambulatory Visit (HOSPITAL_COMMUNITY): Admission: RE | Admit: 2014-10-24 | Payer: 59 | Source: Ambulatory Visit

## 2014-10-24 DIAGNOSIS — R938 Abnormal findings on diagnostic imaging of other specified body structures: Secondary | ICD-10-CM | POA: Diagnosis not present

## 2014-10-24 DIAGNOSIS — Z8541 Personal history of malignant neoplasm of cervix uteri: Secondary | ICD-10-CM | POA: Diagnosis not present

## 2014-10-24 DIAGNOSIS — N939 Abnormal uterine and vaginal bleeding, unspecified: Secondary | ICD-10-CM | POA: Insufficient documentation

## 2014-10-24 NOTE — Telephone Encounter (Signed)
Patient  wanted to discuss bx results and affirm a future appointment.

## 2014-10-25 ENCOUNTER — Ambulatory Visit: Payer: 59 | Admitting: Family

## 2014-10-25 ENCOUNTER — Other Ambulatory Visit: Payer: Self-pay | Admitting: Family

## 2014-10-25 LAB — AEROBIC CULTURE

## 2014-10-25 MED ORDER — SULFAMETHOXAZOLE-TRIMETHOPRIM 800-160 MG PO TABS: 1.0000 | ORAL_TABLET | Freq: Two times a day (BID) | ORAL | Status: DC

## 2014-10-28 ENCOUNTER — Encounter: Payer: Self-pay | Admitting: Family Medicine

## 2014-10-28 ENCOUNTER — Ambulatory Visit (INDEPENDENT_AMBULATORY_CARE_PROVIDER_SITE_OTHER): Payer: 59 | Admitting: Family Medicine

## 2014-10-28 VITALS — BP 157/92 | HR 104 | Temp 100.0°F | Ht 66.0 in | Wt 179.0 lb

## 2014-10-28 DIAGNOSIS — L089 Local infection of the skin and subcutaneous tissue, unspecified: Secondary | ICD-10-CM | POA: Diagnosis not present

## 2014-10-28 DIAGNOSIS — E039 Hypothyroidism, unspecified: Secondary | ICD-10-CM

## 2014-10-28 DIAGNOSIS — R609 Edema, unspecified: Secondary | ICD-10-CM | POA: Diagnosis not present

## 2014-10-28 DIAGNOSIS — L03116 Cellulitis of left lower limb: Secondary | ICD-10-CM

## 2014-10-28 DIAGNOSIS — E785 Hyperlipidemia, unspecified: Secondary | ICD-10-CM | POA: Diagnosis not present

## 2014-10-28 LAB — POCT CBC
Granulocyte percent: 69.2 %G (ref 37–80)
HCT, POC: 39 % (ref 37.7–47.9)
Hemoglobin: 12.3 g/dL (ref 12.2–16.2)
LYMPH, POC: 2.3 (ref 0.6–3.4)
MCH, POC: 27.8 pg (ref 27–31.2)
MCHC: 31.5 g/dL — AB (ref 31.8–35.4)
MCV: 88.2 fL (ref 80–97)
MPV: 7.9 fL (ref 0–99.8)
PLATELET COUNT, POC: 235 10*3/uL (ref 142–424)
POC GRANULOCYTE: 6.5 (ref 2–6.9)
POC LYMPH PERCENT: 24.2 %L (ref 10–50)
RBC: 4.42 M/uL (ref 4.04–5.48)
RDW, POC: 14.7 %
WBC: 9.4 10*3/uL (ref 4.6–10.2)

## 2014-10-28 MED ORDER — CEPHALEXIN 500 MG PO CAPS: 500.0000 mg | ORAL_CAPSULE | Freq: Three times a day (TID) | ORAL | Status: DC

## 2014-10-28 NOTE — Patient Instructions (Addendum)
Continue current medications. Continue good therapeutic lifestyle changes which include good diet and exercise. Fall precautions discussed with patient. If an FOBT was given today- please return it to our front desk. If you are over 64 years old - you may need Prevnar 65 or the adult Pneumonia vaccine.  Flu Shots are still available at our office. If you still haven't had one please call to set up a nurse visit to get one.   After your visit with [place] today you will receive a survey in the mail or online from [employer] regarding your care with [place]. Please take a moment to fill this out. Your feedback is very important to [place] as you can help [place] better understand your patient needs as well as improve your experience and satisfaction. WE CARE ABOUT YOU!!!  Basic Carbohydrate Counting for Diabetes Mellitus Carbohydrate counting is a method for keeping track of the amount of carbohydrates you eat. Eating carbohydrates naturally increases the level of sugar (glucose) in your blood, so it is important for you to know the amount that is okay for you to have in every meal. Carbohydrate counting helps keep the level of glucose in your blood within normal limits. The amount of carbohydrates allowed is different for every person. A dietitian can help you calculate the amount that is right for you. Once you know the amount of carbohydrates you can have, you can count the carbohydrates in the foods you want to eat. Carbohydrates are found in the following foods:  Grains, such as breads and cereals.  Dried beans and soy products.  Starchy vegetables, such as potatoes, peas, and corn.  Fruit and fruit juices.  Milk and yogurt.  Sweets and snack foods, such as cake, cookies, candy, chips, soft drinks, and fruit drinks. CARBOHYDRATE COUNTING There are two ways to count the carbohydrates in your food. You can use either of the methods or a combination of both. Reading the "Nutrition Facts" on [place] The  "Nutrition Facts" is an area that is included on the labels of almost all packaged food and beverages in the [place]. It includes the serving size of that food or beverage and information about the nutrients in each serving of the food, including the grams (g) of carbohydrate per serving.  Decide the number of servings of this food or beverage that you will be able to eat or drink. Multiply that number of servings by the number of grams of carbohydrate that is listed on the label for that serving. The total will be the amount of carbohydrates you will be having when you eat or drink this food or beverage. Learning Standard Serving Sizes of Food When you eat food that is not packaged or does not include "Nutrition Facts" on the label, you need to measure the servings in order to count the amount of carbohydrates.A serving of most carbohydrate-rich foods contains about 15 g of carbohydrates. The following list includes serving sizes of carbohydrate-rich foods that provide 15 g ofcarbohydrate per serving:   1 slice of bread (1 oz) or 1 six-inch tortilla.    of a hamburger bun or English muffin.  4-6 crackers.   cup unsweetened dry cereal.    cup hot cereal.   cup rice or pasta.    cup mashed potatoes or  of a large baked potato.  1 cup fresh fruit or one small piece of fruit.    cup canned or frozen fruit or fruit juice.  1 cup milk.  cup plain fat-free yogurt or yogurt sweetened with artificial sweeteners.   cup cooked dried beans or starchy vegetable, such as peas, corn, or potatoes.  Decide the number of standard-size servings that you will eat. Multiply that number of servings by 15 (the grams of carbohydrates in that serving). For example, if you eat 2 cups of strawberries, you will have eaten 2 servings and 30 g of carbohydrates (2 servings x 15 g = 30 g). For foods such as soups and casseroles, in which more than one food is mixed in, you will need to count the  carbohydrates in each food that is included. EXAMPLE OF CARBOHYDRATE COUNTING Sample Dinner  3 oz chicken breast.   cup of brown rice.   cup of corn.  1 cup milk.   1 cup strawberries with sugar-free whipped topping.  Carbohydrate Calculation Step 1: Identify the foods that contain carbohydrates:   Rice.   Corn.   Milk.   Strawberries. Step 2:Calculate the number of servings eaten of each:   2 servings of rice.   1 serving of corn.   1 serving of milk.   1 serving of strawberries. Step 3: Multiply each of those number of servings by 15 g:   2 servings of rice x 15 g = 30 g.   1 serving of corn x 15 g = 15 g.   1 serving of milk x 15 g = 15 g.   1 serving of strawberries x 15 g = 15 g. Step 4: Add together all of the amounts to find the total grams of carbohydrates eaten: 30 g + 15 g + 15 g + 15 g = 75 g. Document Released: 03/29/2005 Document Revised: 08/13/2013 Document Reviewed: 02/23/2013 [facility] Patient Information 2015 [place], [place]. This information is not intended to replace advice given to you by your health care provider. Make sure you discuss any questions you have with your health care provider.

## 2014-10-28 NOTE — Progress Notes (Signed)
Subjective:    Patient ID: Heather Leblanc, female    DOB: [birth year], 64 y.o.   MRN: [id number]  HPI Patient here today for 2 month rck and follow up on infection of mole.  Since her initial evaluation for weight and bloating and abdominal distention she has undergone ultrasound abdominal CT and now endometrial aspiration all of which have come back negative. She also during that time had a shave excision done on a mole on her leg that turned out to be actinic keratosis. Curiously, it got infected and she has been treated with first Keflex and Bactrim. Culture was taken which shows staph aureus. Since she's been on sulfa she has not felt well and based on culture results of think Keflex would still be okay to use. Today she still complaining of generalized edema. She expresses a concern about "blood poisoning". In an effort to reassure her I checked her white blood count which was in the normal limit      Patient Active Problem List   Diagnosis Date Noted  . Palpitation 07/10/2014  . Urinary tract infectious disease 01/31/2014  . Hydradenitis 01/31/2014  . Peripheral edema 12/03/2013  . White coat syndrome without hypertension 12/03/2013  . Hypothyroidism 12/08/2012  . GAD (generalized anxiety disorder) 12/08/2012   Outpatient Encounter Prescriptions as of 10/28/2014  Medication Sig  . alprazolam (XANAX) 2 MG tablet Take 1 tablet (2 mg total) by mouth 2 (two) times daily.  Marland Kitchen atorvastatin (LIPITOR) 20 MG tablet Take 1 tablet (20 mg total) by mouth daily. (Patient taking differently: Take 20 mg by mouth daily. Take every other day)  . Cholecalciferol (VITAMIN D) 2000 UNITS CAPS Take 2,000 Units by mouth daily.   . furosemide (LASIX) 20 MG tablet Take 1 tablet (20 mg total) by mouth as needed for fluid or edema.  Marland Kitchen ibuprofen (ADVIL,MOTRIN) 600 MG tablet Take 1 tablet (600 mg total) by mouth every 8 (eight) hours as needed.  Marland Kitchen levothyroxine (SYNTHROID, LEVOTHROID) 100 MCG tablet TAKE ONE  TABLET BY MOUTH ONCE DAILY  . meclizine (ANTIVERT) 50 MG tablet Take 1 tablet (50 mg total) by mouth 3 (three) times daily as needed.  . mupirocin ointment (BACTROBAN) 2 % Apply 1 application topically 2 (two) times daily.  . Omega-3 Fatty Acids (FISH OIL PO) Take 1,000 mg by mouth 3 (three) times daily. 2 caos in am, 1 cap in pm  . sulfamethoxazole-trimethoprim (BACTRIM DS,SEPTRA DS) 800-160 MG per tablet Take 1 tablet by mouth 2 (two) times daily.  . [DISCONTINUED] cephALEXin (KEFLEX) 500 MG capsule Take 1 capsule (500 mg total) by mouth 3 (three) times daily.   No facility-administered encounter medications on file as of 10/28/2014.      Review of Systems  Constitutional: Negative.   HENT: Negative.   Eyes: Negative.   Respiratory: Negative.   Cardiovascular: Negative.        Edema- all over  Gastrointestinal: Negative.   Endocrine: Negative.   Genitourinary: Negative.   Musculoskeletal: Negative.   Skin: Negative.        Infection of mole  Allergic/Immunologic: Negative.   Neurological: Negative.   Hematological: Negative.   Psychiatric/Behavioral: Negative.        Objective:   Physical Exam  Constitutional: She appears well-developed and well-nourished.  Pulmonary/Chest: Effort normal and breath sounds normal.  Abdominal: Soft. She exhibits distension.  Psychiatric:  Anxious appearing   BP 157/92 mmHg  Pulse 104  Temp(Src) 100 F (37.8 C) (Oral)  Ht 5'  6" (1.676 m)  Wt 179 lb (81.194 kg)  BMI 28.91 kg/m2        Assessment & Plan:  1. Hypothyroidism, unspecified hypothyroidism type TSH was normal  2. Hyperlipidemia Vascular to hold off on atorvastatin to his she's feeling better - POCT CBC - CMP14+EGFR  3. Skin infection Back to cephalexin rather than Bactrim since her may be allergy - cephALEXin (KEFLEX) 500 MG capsule; Take 1 capsule (500 mg total) by mouth 3 (three) times daily.  Dispense: 21 capsule; Refill: 0  4. Edema We'll check renal  function protein and albumen - POCT CBC - CMP14+EGFR  5. Cellulitis of left lower extremity This has improved on topical and oral anabolic aches. - cephALEXin (KEFLEX) 500 MG capsule; Take 1 capsule (500 mg total) by mouth 3 (three) times daily.  Dispense: 21 capsule; Refill: 0

## 2014-10-29 ENCOUNTER — Encounter: Payer: Self-pay | Admitting: *Deleted

## 2014-10-29 LAB — CMP14+EGFR
A/G RATIO: 1.4 (ref 1.1–2.5)
ALT: 21 IU/L (ref 0–32)
AST: 20 IU/L (ref 0–40)
Albumin: 4.2 g/dL (ref 3.6–4.8)
Alkaline Phosphatase: 83 IU/L (ref 39–117)
BUN/Creatinine Ratio: 10 — ABNORMAL LOW (ref 11–26)
BUN: 8 mg/dL (ref 8–27)
Bilirubin Total: 0.4 mg/dL (ref 0.0–1.2)
CALCIUM: 9.4 mg/dL (ref 8.7–10.3)
CO2: 26 mmol/L (ref 18–29)
CREATININE: 0.79 mg/dL (ref 0.57–1.00)
Chloride: 98 mmol/L (ref 97–108)
GFR calc non Af Amer: 79 mL/min/{1.73_m2} (ref >59)
GFR, EST AFRICAN AMERICAN: 91 mL/min/{1.73_m2} (ref >59)
GLOBULIN, TOTAL: 2.9 g/dL (ref 1.5–4.5)
GLUCOSE: 98 mg/dL (ref 65–99)
Potassium: 5.1 mmol/L (ref 3.5–5.2)
Sodium: 141 mmol/L (ref 134–144)
Total Protein: 7.1 g/dL (ref 6.0–8.5)

## 2014-11-05 ENCOUNTER — Ambulatory Visit: Payer: 59

## 2014-11-05 ENCOUNTER — Encounter: Payer: Self-pay | Admitting: Family Medicine

## 2014-11-05 ENCOUNTER — Ambulatory Visit (INDEPENDENT_AMBULATORY_CARE_PROVIDER_SITE_OTHER): Payer: 59 | Admitting: Family Medicine

## 2014-11-05 ENCOUNTER — Ambulatory Visit (INDEPENDENT_AMBULATORY_CARE_PROVIDER_SITE_OTHER): Payer: 59

## 2014-11-05 VITALS — BP 144/82 | HR 102 | Temp 98.1°F | Ht 66.0 in | Wt 180.0 lb

## 2014-11-05 DIAGNOSIS — R609 Edema, unspecified: Secondary | ICD-10-CM

## 2014-11-05 DIAGNOSIS — M25571 Pain in right ankle and joints of right foot: Secondary | ICD-10-CM

## 2014-11-05 DIAGNOSIS — M25579 Pain in unspecified ankle and joints of unspecified foot: Secondary | ICD-10-CM

## 2014-11-05 DIAGNOSIS — M25572 Pain in left ankle and joints of left foot: Secondary | ICD-10-CM

## 2014-11-05 NOTE — Progress Notes (Signed)
Subjective:    Patient ID: Heather Leblanc, female    DOB: 06/13/1950, 64 y.o.   MRN: [id number]  HPI 64 year old female who is here to follow-up with her dependent edema. She was seen last week. She had normal protein and albumen in renal function. She denies any shortness of breath or chest pain although she worries a lot about heart disease. She was told she needed an echo cardiogram to assess her ejection fraction but there are no symptoms to suggest heart failure with unilateral edema and no shortness of breath. She does have a history of peripheral venous stasis disease and symptoms are entirely consistent with that. She had a procedure on veins of her right leg which is nonswollen but used to look like the left leg which is swollen.  Patient Active Problem List   Diagnosis Date Noted  . Palpitation 07/10/2014  . Urinary tract infectious disease 01/31/2014  . Hydradenitis 01/31/2014  . Peripheral edema 12/03/2013  . White coat syndrome without hypertension 12/03/2013  . Hypothyroidism 12/08/2012  . GAD (generalized anxiety disorder) 12/08/2012   Outpatient Encounter Prescriptions as of 11/05/2014  Medication Sig  . alprazolam (XANAX) 2 MG tablet Take 1 tablet (2 mg total) by mouth 2 (two) times daily.  Marland Kitchen atorvastatin (LIPITOR) 20 MG tablet Take 1 tablet (20 mg total) by mouth daily. (Patient taking differently: Take 20 mg by mouth daily. Take every other day)  . cephALEXin (KEFLEX) 500 MG capsule Take 1 capsule (500 mg total) by mouth 3 (three) times daily.  . Cholecalciferol (VITAMIN D) 2000 UNITS CAPS Take 2,000 Units by mouth daily.   . furosemide (LASIX) 20 MG tablet Take 1 tablet (20 mg total) by mouth as needed for fluid or edema.  Marland Kitchen ibuprofen (ADVIL,MOTRIN) 600 MG tablet Take 1 tablet (600 mg total) by mouth every 8 (eight) hours as needed.  Marland Kitchen levothyroxine (SYNTHROID, LEVOTHROID) 100 MCG tablet TAKE ONE TABLET BY MOUTH ONCE DAILY  . meclizine (ANTIVERT) 50 MG tablet Take 1  tablet (50 mg total) by mouth 3 (three) times daily as needed.  . mupirocin ointment (BACTROBAN) 2 % Apply 1 application topically 2 (two) times daily.  . Omega-3 Fatty Acids (FISH OIL PO) Take 1,000 mg by mouth 3 (three) times daily. 2 caos in am, 1 cap in pm  . [DISCONTINUED] sulfamethoxazole-trimethoprim (BACTRIM DS,SEPTRA DS) 800-160 MG per tablet Take 1 tablet by mouth 2 (two) times daily.   No facility-administered encounter medications on file as of 11/05/2014.       Review of Systems  Constitutional: Negative.   Respiratory: Negative.   Cardiovascular: Positive for leg swelling.  Genitourinary: Negative.   Neurological: Negative.   Psychiatric/Behavioral: The patient is nervous/anxious.        Objective:   Physical Exam  Constitutional: She is oriented to person, place, and time. She appears well-developed and well-nourished.  Cardiovascular: Normal rate, regular rhythm and normal heart sounds.   Pulmonary/Chest: Effort normal and breath sounds normal.  Neurological: She is alert and oriented to person, place, and time.  Psychiatric: She has a normal mood and affect.     BP 144/82 mmHg  Pulse 102  Temp(Src) 98.1 F (36.7 C) (Oral)  Ht 5\' 6"  (1.676 m)  Wt 180 lb (81.647 kg)  BMI 29.07 kg/m2      Assessment & Plan:  1. Pain in joint, ankle and foot, unspecified laterality X-rays of feet bilaterally are normal and do not explain her symptoms. - DG Foot Complete  Left; Future - DG Foot Complete Right; Future   2. Peripheral edema Probable venous stasis disease and stasis dermatitis. I have suggested she wear her support stocking. Take Lasix 40 mg every morning. And Rx for triamcinolone cream for inflammation even though I do not think this is related to her heart, she was seen by cardiologist back in March an echocardiogram was ordered but do not see any record of its completion. We will follow-up on that with cardiology office.  Wardell Honour MD

## 2014-11-06 ENCOUNTER — Telehealth: Payer: Self-pay | Admitting: Cardiology

## 2014-11-06 ENCOUNTER — Telehealth: Payer: Self-pay | Admitting: Family Medicine

## 2014-11-06 MED ORDER — TRIAMCINOLONE ACETONIDE 0.1 % EX CREA: 1.0000 "application " | TOPICAL_CREAM | Freq: Two times a day (BID) | CUTANEOUS | Status: DC

## 2014-11-06 NOTE — Telephone Encounter (Signed)
Patient aware that rx sent to pharmacy. 

## 2014-11-06 NOTE — Telephone Encounter (Signed)
New message       Dr Alain Honey want Dr Percival Spanish to order an echo on this lady.  They said Dr Percival Spanish wanted her to have one some time ago but she did not get it.  Please call and let them know if we still want pt to have an echo and if yes, will we set that up

## 2014-11-06 NOTE — Telephone Encounter (Signed)
Dr. Ammie Ferrier office called wanting this patient to have echo. Sent to Dr. Percival Spanish for go-ahead.

## 2014-11-06 NOTE — Telephone Encounter (Signed)
Triamcinolone cream sent to [facility]. Pt aware

## 2014-11-07 ENCOUNTER — Telehealth: Payer: Self-pay | Admitting: Family Medicine

## 2014-11-07 NOTE — Telephone Encounter (Signed)
Pt states that infection on leg is some better but thinks she needs one more round on keflex   Will need new rx  This will be her 3 rd round recently

## 2014-11-08 NOTE — Telephone Encounter (Signed)
Pt had an Echo in march 2016, do you still need to me order an Echo, if so when?

## 2014-11-08 NOTE — Telephone Encounter (Signed)
Ok to order echocardiogram.  

## 2014-11-09 NOTE — Telephone Encounter (Signed)
Was seen this week; no evidence of infection; does not need more antibiotic

## 2014-11-11 ENCOUNTER — Telehealth: Payer: Self-pay | Admitting: Family Medicine

## 2014-11-12 ENCOUNTER — Ambulatory Visit (INDEPENDENT_AMBULATORY_CARE_PROVIDER_SITE_OTHER): Payer: 59 | Admitting: Family

## 2014-11-12 ENCOUNTER — Encounter: Payer: Self-pay | Admitting: Family

## 2014-11-12 VITALS — BP 146/87 | HR 103 | Temp 98.0°F | Ht 66.0 in | Wt 179.6 lb

## 2014-11-12 DIAGNOSIS — R609 Edema, unspecified: Secondary | ICD-10-CM | POA: Diagnosis not present

## 2014-11-12 MED ORDER — FUROSEMIDE 40 MG PO TABS: 40.0000 mg | ORAL_TABLET | Freq: Two times a day (BID) | ORAL | Status: DC

## 2014-11-12 NOTE — Telephone Encounter (Signed)
I reviewed this.  She does not need another echo.

## 2014-11-12 NOTE — Progress Notes (Signed)
   Subjective:    Patient ID: Heather Leblanc, female    DOB: [birth year], 64 y.o.   MRN: [id number]  HPI Pt presents to the office today for bilateral swelling in legs and feet. Pt states this started about a month ago and states it just continues to get worse. Pt states she went a cardiologists for palpations and pt has family history of heart disease.  Pt states she is taking 40 mg of lasix daily with minium relief.    Review of Systems  Constitutional: Negative.   HENT: Negative.   Eyes: Negative.   Respiratory: Negative.  Negative for shortness of breath.   Cardiovascular: Negative.  Negative for palpitations.  Gastrointestinal: Negative.   Endocrine: Negative.   Genitourinary: Negative.   Musculoskeletal: Negative.   Neurological: Negative.  Negative for headaches.  Hematological: Negative.   Psychiatric/Behavioral: Negative.   All other systems reviewed and are negative.      Objective:   Physical Exam  Constitutional: She is oriented to person, place, and time. She appears well-developed and well-nourished. No distress.  HENT:  Head: Normocephalic and atraumatic.  Eyes: Pupils are equal, round, and reactive to light.  Neck: Normal range of motion. Neck supple. No thyromegaly present.  Cardiovascular: Normal rate, regular rhythm, normal heart sounds and intact distal pulses.   No murmur heard. Pulmonary/Chest: Effort normal and breath sounds normal. No respiratory distress. She has no wheezes.  Abdominal: Soft. Bowel sounds are normal. She exhibits no distension. There is no tenderness.  Musculoskeletal: Normal range of motion. She exhibits edema (3+ in BLE with mild erythemas). She exhibits no tenderness.  Neurological: She is alert and oriented to person, place, and time. She has normal reflexes. No cranial nerve deficit.  Skin: Skin is warm and dry.  Psychiatric: She has a normal mood and affect. Her behavior is normal. Judgment and thought content normal.  Vitals  reviewed.     BP 146/87 mmHg  Pulse 103  Temp(Src) 98 F (36.7 C) (Oral)  Ht $R'5\' 6"'Fj$  (1.676 m)  Wt 179 lb 9.6 oz (81.466 kg)  BMI 29.00 kg/m2     Assessment & Plan:  1. Peripheral edema -Keep feel elevated -Compression hose daily -Low salt diet -Keep follow up with Dr. Sabra Heck - CMP14+EGFR - Brain natriuretic peptide - furosemide (LASIX) 40 MG tablet; Take 1 tablet (40 mg total) by mouth 2 (two) times daily.  Dispense: 60 tablet; Refill: [place], FNP

## 2014-11-12 NOTE — Telephone Encounter (Signed)
Patient was calling checking on her Echo appointment and patient had a Echo 06/2014. I tried to explain to patient that she did have a echo in March and we will let Dr. Sabra Heck know that. Patient also states that her legs are not improving and appointment given for today @ 10:25 with [facility].

## 2014-11-12 NOTE — Patient Instructions (Signed)
Peripheral Edema You have swelling in your legs (peripheral edema). This swelling is due to excess accumulation of salt and water in your body. Edema may be a sign of heart, kidney or liver disease, or a side effect of a medication. It may also be due to problems in the leg veins. Elevating your legs and using special support stockings may be very helpful, if the cause of the swelling is due to poor venous circulation. Avoid long periods of standing, whatever the cause. Treatment of edema depends on identifying the cause. Chips, pretzels, pickles and other salty foods should be avoided. Restricting salt in your diet is almost always needed. Water pills (diuretics) are often used to remove the excess salt and water from your body via urine. These medicines prevent the kidney from reabsorbing sodium. This increases urine flow. Diuretic treatment may also result in lowering of potassium levels in your body. Potassium supplements may be needed if you have to use diuretics daily. Daily weights can help you keep track of your progress in clearing your edema. You should call your caregiver for follow up care as recommended. SEEK IMMEDIATE MEDICAL CARE IF:   You have increased swelling, pain, redness, or heat in your legs.  You develop shortness of breath, especially when lying down.  You develop chest or abdominal pain, weakness, or fainting.  You have a fever. Document Released: 05/06/2004 Document Revised: 06/21/2011 Document Reviewed: 04/16/2009 [facility] Patient Information 2015 [place], [place]. This information is not intended to replace advice given to you by your health care provider. Make sure you discuss any questions you have with your health care provider. Edema Edema is an abnormal buildup of fluids in your bodytissues. Edema is somewhatdependent on gravity to pull the fluid to the lowest place in your body. That makes the condition more common in the legs and thighs (lower extremities).  Painless swelling of the feet and ankles is common and becomes more likely as you get older. It is also common in looser tissues, like around your eyes.  When the affected area is squeezed, the fluid may move out of that spot and leave a dent for a few moments. This dent is called pitting.  CAUSES  There are many possible causes of edema. Eating too much salt and being on your feet or sitting for a long time can cause edema in your legs and ankles. Hot weather may make edema worse. Common medical causes of edema include:  Heart failure.  Liver disease.  Kidney disease.  Weak blood vessels in your legs.  Cancer.  An injury.  Pregnancy.  Some medications.  Obesity. SYMPTOMS  Edema is usually painless.Your skin may look swollen or shiny.  DIAGNOSIS  Your health care provider may be able to diagnose edema by asking about your medical history and doing a physical exam. You may need to have tests such as X-rays, an electrocardiogram, or blood tests to check for medical conditions that may cause edema.  TREATMENT  Edema treatment depends on the cause. If you have heart, liver, or kidney disease, you need the treatment appropriate for these conditions. General treatment may include:  Elevation of the affected body part above the level of your heart.  Compression of the affected body part. Pressure from elastic bandages or support stockings squeezes the tissues and forces fluid back into the blood vessels. This keeps fluid from entering the tissues.  Restriction of fluid and salt intake.  Use of a water pill (diuretic). These medications are appropriate  appropriate only for some types of edema. They pull fluid out of your body and make you urinate more often. This gets rid of fluid and reduces swelling, but diuretics can have side effects. Only use diuretics as directed by your health care provider. HOME CARE INSTRUCTIONS   Keep the affected body part above the level of your heart when you are  lying down.   Do not sit still or stand for prolonged periods.   Do not put anything directly under your knees when lying down.  Do not wear constricting clothing or garters on your upper legs.   Exercise your legs to work the fluid back into your blood vessels. This may help the swelling go down.   Wear elastic bandages or support stockings to reduce ankle swelling as directed by your health care provider.   Eat a low-salt diet to reduce fluid if your health care provider recommends it.   Only take medicines as directed by your health care provider. SEEK MEDICAL CARE IF:   Your edema is not responding to treatment.  You have heart, liver, or kidney disease and notice symptoms of edema.  You have edema in your legs that does not improve after elevating them.   You have sudden and unexplained weight gain. SEEK IMMEDIATE MEDICAL CARE IF:   You develop shortness of breath or chest pain.   You cannot breathe when you lie down.  You develop pain, redness, or warmth in the swollen areas.   You have heart, liver, or kidney disease and suddenly get edema.  You have a fever and your symptoms suddenly get worse. MAKE SURE YOU:   Understand these instructions.  Will watch your condition.  Will get help right away if you are not doing well or get worse. Document Released: 03/29/2005 Document Revised: 08/13/2013 Document Reviewed: 01/19/2013 [employer] Patient Information 2015 [employer]. This information is not intended to replace advice given to you by your health care provider. Make sure you discuss any questions you have with your health care provider.  

## 2014-11-13 LAB — CMP14+EGFR
A/G RATIO: 1.6 (ref 1.1–2.5)
ALBUMIN: 4.4 g/dL (ref 3.6–4.8)
ALT: 20 IU/L (ref 0–32)
AST: 20 IU/L (ref 0–40)
Alkaline Phosphatase: 84 IU/L (ref 39–117)
BILIRUBIN TOTAL: 0.4 mg/dL (ref 0.0–1.2)
BUN/Creatinine Ratio: 16 (ref 11–26)
BUN: 12 mg/dL (ref 8–27)
CO2: 29 mmol/L (ref 18–29)
CREATININE: 0.74 mg/dL (ref 0.57–1.00)
Calcium: 9.5 mg/dL (ref 8.7–10.3)
Chloride: 95 mmol/L — ABNORMAL LOW (ref 97–108)
GFR calc Af Amer: 99 mL/min/{1.73_m2} (ref >59)
GFR calc non Af Amer: 86 mL/min/{1.73_m2} (ref >59)
GLOBULIN, TOTAL: 2.7 g/dL (ref 1.5–4.5)
Glucose: 108 mg/dL — ABNORMAL HIGH (ref 65–99)
POTASSIUM: 3.8 mmol/L (ref 3.5–5.2)
SODIUM: 142 mmol/L (ref 134–144)
TOTAL PROTEIN: 7.1 g/dL (ref 6.0–8.5)

## 2014-11-13 LAB — BRAIN NATRIURETIC PEPTIDE: BNP: 6.8 pg/mL (ref 0.0–100.0)

## 2014-11-13 NOTE — Telephone Encounter (Signed)
-----   Message from Sharion Balloon, Clarissa sent at 11/13/2014  2:07 PM EDT ----- Kidney and liver function stable BNP WNL

## 2014-11-13 NOTE — Telephone Encounter (Signed)
Spoke with pt, told her I will send Dr Sabra Heck the result of her Echo, pt stated she have an appt with Dr Sabra Heck next week.  Result of Echo was send to Dr Sabra Heck via [employer]

## 2014-11-13 NOTE — Telephone Encounter (Signed)
Aware of lab results.  Patient aware , need to keep feet elevated to help with swelling.

## 2014-11-21 ENCOUNTER — Ambulatory Visit (INDEPENDENT_AMBULATORY_CARE_PROVIDER_SITE_OTHER): Payer: 59 | Admitting: Family Medicine

## 2014-11-21 ENCOUNTER — Encounter: Payer: Self-pay | Admitting: Family Medicine

## 2014-11-21 VITALS — BP 140/77 | HR 97 | Temp 98.3°F | Ht 66.0 in | Wt 180.0 lb

## 2014-11-21 DIAGNOSIS — R609 Edema, unspecified: Secondary | ICD-10-CM

## 2014-11-21 DIAGNOSIS — E039 Hypothyroidism, unspecified: Secondary | ICD-10-CM | POA: Diagnosis not present

## 2014-11-21 MED ORDER — METOLAZONE 2.5 MG PO TABS: ORAL_TABLET | ORAL | Status: DC

## 2014-11-21 NOTE — Progress Notes (Signed)
   Subjective:    Patient ID: Heather Leblanc, female    DOB: Aug 04, 1950, 64 y.o.   MRN: [id number]  HPI 64 year old female here to follow-up edema. We have looked at causes of edema there is no evidence of heart disease based on normal ejection fraction BNP. Liver function tests are normal. Renal function including BUN/creatinine GFR is all normal. She does have a history of venous disease has had some sort of ablative procedures done on the right leg pain clinic. She is now taking 80 mg of Lasix a day without much effect on her symptoms.    Review of Systems  Constitutional: Negative.   HENT: Negative.   Respiratory: Negative.   Cardiovascular: Positive for leg swelling.  Gastrointestinal: Positive for abdominal distention.  Endocrine: Negative.   Psychiatric/Behavioral: The patient is nervous/anxious.    Patient Active Problem List   Diagnosis Date Noted  . Palpitation 07/10/2014  . Urinary tract infectious disease 01/31/2014  . Hydradenitis 01/31/2014  . Peripheral edema 12/03/2013  . White coat syndrome without hypertension 12/03/2013  . Hypothyroidism 12/08/2012  . GAD (generalized anxiety disorder) 12/08/2012   Outpatient Encounter Prescriptions as of 11/21/2014  Medication Sig  . alprazolam (XANAX) 2 MG tablet Take 1 tablet (2 mg total) by mouth 2 (two) times daily.  Marland Kitchen atorvastatin (LIPITOR) 20 MG tablet Take 1 tablet (20 mg total) by mouth daily.  . Cholecalciferol (VITAMIN D) 2000 UNITS CAPS Take 2,000 Units by mouth daily.   . furosemide (LASIX) 40 MG tablet Take 1 tablet (40 mg total) by mouth 2 (two) times daily.  Marland Kitchen ibuprofen (ADVIL,MOTRIN) 600 MG tablet Take 1 tablet (600 mg total) by mouth every 8 (eight) hours as needed.  Marland Kitchen levothyroxine (SYNTHROID, LEVOTHROID) 100 MCG tablet TAKE ONE TABLET BY MOUTH ONCE DAILY  . meclizine (ANTIVERT) 50 MG tablet Take 1 tablet (50 mg total) by mouth 3 (three) times daily as needed.  . mupirocin ointment (BACTROBAN) 2 % Apply 1  application topically 2 (two) times daily.  . Omega-3 Fatty Acids (FISH OIL PO) Take 1,000 mg by mouth 3 (three) times daily. 2 caos in am, 1 cap in pm  . triamcinolone cream (KENALOG) 0.1 % Apply 1 application topically 2 (two) times daily.   No facility-administered encounter medications on file as of 11/21/2014.       Objective:   Physical Exam  Constitutional: She appears well-developed and well-nourished.  Cardiovascular: Normal rate, regular rhythm and intact distal pulses.   Pulmonary/Chest: Effort normal and breath sounds normal.  Abdominal: Soft. She exhibits distension.  Musculoskeletal: She exhibits edema.  Edema is 2-3+ distally          Assessment & Plan:  1. Hypothyroidism, unspecified hypothyroidism type Last TSH was done and may and is normal so hypothyroidism should not be part of her etiology.  2. Peripheral edema This edema appears to be idiopathic. I will continue Lasix at 40 mg a day and add metolazone 2.5 mg on Monday Wednesday Friday. Would also like to refer her to vascular surgeons to assess veins in her legs  Wardell Honour MD

## 2014-11-25 ENCOUNTER — Telehealth: Payer: Self-pay | Admitting: Family Medicine

## 2014-12-03 ENCOUNTER — Other Ambulatory Visit: Payer: Self-pay | Admitting: *Deleted

## 2014-12-03 ENCOUNTER — Ambulatory Visit (INDEPENDENT_AMBULATORY_CARE_PROVIDER_SITE_OTHER): Payer: 59 | Admitting: Family Medicine

## 2014-12-03 VITALS — BP 125/71 | HR 99 | Temp 97.9°F | Ht 66.0 in | Wt 174.2 lb

## 2014-12-03 DIAGNOSIS — F411 Generalized anxiety disorder: Secondary | ICD-10-CM | POA: Diagnosis not present

## 2014-12-03 DIAGNOSIS — R002 Palpitations: Secondary | ICD-10-CM | POA: Diagnosis not present

## 2014-12-03 DIAGNOSIS — R609 Edema, unspecified: Secondary | ICD-10-CM

## 2014-12-03 DIAGNOSIS — E039 Hypothyroidism, unspecified: Secondary | ICD-10-CM | POA: Diagnosis not present

## 2014-12-03 MED ORDER — NYSTATIN 100000 UNIT/GM EX CREA: 1.0000 "application " | TOPICAL_CREAM | Freq: Two times a day (BID) | CUTANEOUS | Status: DC

## 2014-12-03 NOTE — Progress Notes (Signed)
   Subjective:    Patient ID: Heather Leblanc, female    DOB: 01-12-1951, 64 y.o.   MRN: [id number]  HPI 64 year old female here to follow-up peripheral edema and general anxiety disorder. On the schedule, she was said to have toenail fungus and requesting treatment but there is only minimal involvement in one of the smaller toenails and I would not recommend treatment. There is however some scaling on the dorsum of her left foot. Since starting on metolazone with Lasix last week she has lost about 6 pounds She also has questions about need for Zostavax at our records show that she received this immunization in 2012.  Patient Active Problem List   Diagnosis Date Noted  . Palpitation 07/10/2014  . Urinary tract infectious disease 01/31/2014  . Hydradenitis 01/31/2014  . Peripheral edema 12/03/2013  . White coat syndrome without hypertension 12/03/2013  . Hypothyroidism 12/08/2012  . GAD (generalized anxiety disorder) 12/08/2012   Outpatient Encounter Prescriptions as of 12/03/2014  Medication Sig  . alprazolam (XANAX) 2 MG tablet Take 1 tablet (2 mg total) by mouth 2 (two) times daily.  Marland Kitchen atorvastatin (LIPITOR) 20 MG tablet Take 1 tablet (20 mg total) by mouth daily.  . Cholecalciferol (VITAMIN D) 2000 UNITS CAPS Take 2,000 Units by mouth daily.   . furosemide (LASIX) 40 MG tablet Take 1 tablet (40 mg total) by mouth 2 (two) times daily. (Patient taking differently: Take 40 mg by mouth daily. )  . levothyroxine (SYNTHROID, LEVOTHROID) 100 MCG tablet TAKE ONE TABLET BY MOUTH ONCE DAILY  . meclizine (ANTIVERT) 50 MG tablet Take 1 tablet (50 mg total) by mouth 3 (three) times daily as needed.  . metolazone (ZAROXOLYN) 2.5 MG tablet Take one tablet by mouth on Mon, Wed, and Fri  . mupirocin ointment (BACTROBAN) 2 % Apply 1 application topically 2 (two) times daily.  . Omega-3 Fatty Acids (FISH OIL PO) Take 1,000 mg by mouth 3 (three) times daily. 2 caos in am, 1 cap in pm  . triamcinolone cream  (KENALOG) 0.1 % Apply 1 application topically 2 (two) times daily.  Marland Kitchen ibuprofen (ADVIL,MOTRIN) 600 MG tablet Take 1 tablet (600 mg total) by mouth every 8 (eight) hours as needed. (Patient not taking: Reported on 12/03/2014)   No facility-administered encounter medications on file as of 12/03/2014.      Review of Systems  Constitutional: Negative.   Respiratory: Negative.   Cardiovascular: Positive for leg swelling.  Genitourinary: Negative.   Neurological: Negative.        Objective:   Physical Exam  Constitutional: She appears well-developed.  Cardiovascular: Normal rate and regular rhythm.   Pulmonary/Chest: Effort normal and breath sounds normal.  Abdominal: Soft. She exhibits distension.  Skin:  Ko H prep done on dorsum of left foot shows monilia  Psychiatric: She has a normal mood and affect.          Assessment & Plan:  1. Hypothyroidism, unspecified hypothyroidism type TSH was recently checked visit and is in the therapeutic range. Med refill today  2. Palpitation She had a 24-hour Holter monitor but still complains of palpitations will check into new technology available here in the office as far as additional monitoring  3. Peripheral edema Edema has improved. Continue Lasix 40 mg a day with most total is on 2.5 mg on Monday Wednesday and Friday  4. GAD (generalized anxiety disorder) I think symptoms have improved on regular doses of alprazolam and should continue  Wardell Honour MD

## 2014-12-04 ENCOUNTER — Telehealth: Payer: Self-pay | Admitting: Family Medicine

## 2014-12-04 ENCOUNTER — Encounter: Payer: Self-pay | Admitting: Family Medicine

## 2014-12-05 NOTE — Telephone Encounter (Signed)
The heart monitor is not yet available to [place]

## 2014-12-09 ENCOUNTER — Telehealth: Payer: Self-pay | Admitting: *Deleted

## 2014-12-09 DIAGNOSIS — R609 Edema, unspecified: Secondary | ICD-10-CM

## 2014-12-09 NOTE — Telephone Encounter (Signed)
Would like referral to a new cardiologist ASAP. She was seeing Dr Percival Spanish and would like to be evaluated by someone else.  She is concerned about the peripheral edema and possible heart failure. Echocardiogram was normal in 06/2014.

## 2014-12-17 ENCOUNTER — Ambulatory Visit (INDEPENDENT_AMBULATORY_CARE_PROVIDER_SITE_OTHER): Payer: 59 | Admitting: Family Medicine

## 2014-12-17 ENCOUNTER — Encounter: Payer: Self-pay | Admitting: Family Medicine

## 2014-12-17 VITALS — BP 102/74 | HR 102 | Temp 98.0°F | Ht 66.0 in | Wt 174.0 lb

## 2014-12-17 DIAGNOSIS — R002 Palpitations: Secondary | ICD-10-CM | POA: Diagnosis not present

## 2014-12-17 NOTE — Progress Notes (Signed)
Subjective:    Patient ID: Heather Leblanc, female    DOB: October 19, 1950, 64 y.o.   MRN: [id number]  HPI 64 year old female here for follow-up visit for dependent edema, and continued palpitations. Her edema is stable as is her weight. She is managed with combination of Lasix and metolazone. Edema is felt to be venous in origin and is primarily in her left leg. She has also had stasis dermatitis itch has improved but had a KOH-positive prep at her last visit and is being treated with nystatin and triamcinolone on the dorsum of her left and now right feet She continues to experience palpitations and is very anxious about this. She had had a Holter monitor previously which was normal. Palpitations are not associated with syncope or chest pain and she cannot relate them to any activity. She does not use caffeine.  Patient Active Problem List   Diagnosis Date Noted  . Palpitation 07/10/2014  . Urinary tract infectious disease 01/31/2014  . Hydradenitis 01/31/2014  . Peripheral edema 12/03/2013  . White coat syndrome without hypertension 12/03/2013  . Hypothyroidism 12/08/2012  . GAD (generalized anxiety disorder) 12/08/2012   Outpatient Encounter Prescriptions as of 12/17/2014  Medication Sig  . alprazolam (XANAX) 2 MG tablet Take 1 tablet (2 mg total) by mouth 2 (two) times daily.  Marland Kitchen atorvastatin (LIPITOR) 20 MG tablet Take 1 tablet (20 mg total) by mouth daily.  . Cholecalciferol (VITAMIN D) 2000 UNITS CAPS Take 2,000 Units by mouth daily.   . furosemide (LASIX) 40 MG tablet Take 1 tablet (40 mg total) by mouth 2 (two) times daily. (Patient taking differently: Take 40 mg by mouth daily. )  . ibuprofen (ADVIL,MOTRIN) 600 MG tablet Take 1 tablet (600 mg total) by mouth every 8 (eight) hours as needed.  Marland Kitchen levothyroxine (SYNTHROID, LEVOTHROID) 100 MCG tablet TAKE ONE TABLET BY MOUTH ONCE DAILY  . metolazone (ZAROXOLYN) 2.5 MG tablet Take one tablet by mouth on Mon, Wed, and Fri  . mupirocin ointment  (BACTROBAN) 2 % Apply 1 application topically 2 (two) times daily.  Marland Kitchen nystatin cream (MYCOSTATIN) Apply 1 application topically 2 (two) times daily. Apply to affected area BID  . Omega-3 Fatty Acids (FISH OIL PO) Take 1,000 mg by mouth 3 (three) times daily. 2 caos in am, 1 cap in pm  . triamcinolone cream (KENALOG) 0.1 % Apply 1 application topically 2 (two) times daily.  . [DISCONTINUED] meclizine (ANTIVERT) 50 MG tablet Take 1 tablet (50 mg total) by mouth 3 (three) times daily as needed.   No facility-administered encounter medications on file as of 12/17/2014.       Review of Systems  Constitutional: Negative.   Respiratory: Negative.   Cardiovascular: Positive for palpitations.  Gastrointestinal: Negative.   Genitourinary: Negative.   Neurological: Negative.        Objective:   Physical Exam  Constitutional: She is oriented to person, place, and time. She appears well-developed.  Cardiovascular: Normal rate.   Rhythm seems a little irregular to my auscultation that EKG done during this visit shows normal sinus rhythm without PACs or other extrasystoles  Pulmonary/Chest: Effort normal and breath sounds normal.  Neurological: She is alert and oriented to person, place, and time.  Psychiatric: She has a normal mood and affect. Her behavior is normal.     BP 102/74 mmHg  Pulse 102  Temp(Src) 98 F (36.7 C) (Oral)  Ht 5\' 6"  (1.676 m)  Wt 174 lb (78.926 kg)  BMI 28.10 kg/m2  Assessment & Plan:  1. Palpitations EKG shows normal sinus rhythm and no other extrasystoles. She might benefit from a beta blocker to diminish her perception of these palpitations. Cardiology consult is still in the works.  Wardell Honour MD - EKG 12-Lead

## 2014-12-18 ENCOUNTER — Other Ambulatory Visit: Payer: Self-pay

## 2014-12-18 DIAGNOSIS — Z1211 Encounter for screening for malignant neoplasm of colon: Secondary | ICD-10-CM

## 2014-12-24 ENCOUNTER — Encounter: Payer: Self-pay | Admitting: Vascular Surgery

## 2014-12-26 ENCOUNTER — Encounter (HOSPITAL_COMMUNITY): Payer: 59

## 2014-12-26 ENCOUNTER — Encounter: Payer: 59 | Admitting: Vascular Surgery

## 2014-12-30 ENCOUNTER — Telehealth: Payer: Self-pay | Admitting: Family Medicine

## 2014-12-31 ENCOUNTER — Encounter: Payer: Self-pay | Admitting: Cardiology

## 2014-12-31 ENCOUNTER — Ambulatory Visit (INDEPENDENT_AMBULATORY_CARE_PROVIDER_SITE_OTHER): Payer: 59 | Admitting: Cardiology

## 2014-12-31 VITALS — BP 129/80 | HR 97 | Ht 66.0 in | Wt 170.8 lb

## 2014-12-31 DIAGNOSIS — E785 Hyperlipidemia, unspecified: Secondary | ICD-10-CM

## 2014-12-31 DIAGNOSIS — I872 Venous insufficiency (chronic) (peripheral): Secondary | ICD-10-CM | POA: Diagnosis not present

## 2014-12-31 DIAGNOSIS — R002 Palpitations: Secondary | ICD-10-CM | POA: Diagnosis not present

## 2014-12-31 NOTE — Patient Instructions (Signed)
Your physician recommends that you continue on your current medications as directed. Please refer to the Current Medication list given to you today. Your physician has recommended that you wear a 7 day event monitor. Event monitors are medical devices that record the heart's electrical activity. Doctors most often [place] these monitors to diagnose arrhythmias. Arrhythmias are problems with the speed or rhythm of the heartbeat. The monitor is a small, portable device. You can wear one while you do your normal daily activities. This is usually used to diagnose what is causing palpitations/syncope (passing out). We will call you with your results.

## 2014-12-31 NOTE — Progress Notes (Signed)
Cardiology Office Note  Date: 12/31/2014   ID: Heather Leblanc, DOB 1951-02-21, MRN [id number]  PCP: Wardell Honour, MD  Consulting Cardiologist: Rozann Lesches, MD   Chief Complaint  Patient presents with  . Palpitations    History of Present Illness: Heather Leblanc is a 64 y.o. female patient of Dr. Percival Spanish, referred back to the office with continued palpitations. This is my first meeting with her. I reviewed records and cardiac testing within the last year including echocardiogram and Holter monitor.  We discussed her symptoms today. Frankly, I did have some difficulty following her history as she was somewhat tangential and related a lot of information about other health care visits and encounters that did not necessarily pertain to her present symptomatology. She is fairly clear in telling me that she noticed a worsening sense of palpitations when her Xanax dose was cut back suddenly earlier in the year. She tells me that once this dose was increased again by Dr. Sabra Heck, she had some improvement in palpitations, but again a worsening in symptoms about a month later. She describes a daily sense of "quivering" in her chest and sometimes a feeling of "pounding" that can wake her up at nighttime. At no point has she had syncope with these symptoms. She reports having an episode while she was in the office for a visit with Dr. Sabra Heck, but that by the time an ECG was obtained, her symptoms had resolved.  She has obtained her medical records and reviewed the results of the echocardiogram and Holter monitor that were ordered by Dr. Percival Spanish. I discussed these results with her today and explained the significance.  Holter monitor done back in April showed sinus rhythm with occasional PACs, brief 3 beat atrial runs, rare PVCs. There were no significant pauses although one dropped beat was noted. Importantly, she did not have any definitive atrial fibrillation or ventricular arrhythmias. Her  echocardiogram showed normal LVEF of 60-65% with mild diastolic dysfunction, and no major valvular abnormalities.  She reports problems with what sounds like venous insufficiency in her legs, previous right leg vein surgery with pending assessment of her left leg, and chronic leg edema on diuretics.  She tells me that her mother had rheumatic heart disease, and her father underwent bypass surgery in his 7s. She does not have any personal history of type 2 diabetes mellitus or hypertension.  Past Medical History  Diagnosis Date  . Hypothyroidism   . Anxiety   . Eczema   . Cystitis   . Premature menopause   . History of phlebitis   . 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 History of seasonal allergies   . Grand mal seizure     Hx  (1)  . Depression     Past Surgical History  Procedure Laterality Date  . Right leg vein surgery      Current Outpatient Prescriptions  Medication Sig Dispense Refill  . alprazolam (XANAX) 2 MG tablet Take 1 tablet (2 mg total) by mouth 2 (two) times daily. 60 tablet 5  . atorvastatin (LIPITOR) 20 MG tablet Take 20 mg by mouth. Monday & Thursday    . Cholecalciferol (VITAMIN D) 2000 UNITS CAPS Take 2,000 Units by mouth daily.     . furosemide (LASIX) 40 MG tablet Take 40 mg by mouth daily.    Marland Kitchen levothyroxine (SYNTHROID, LEVOTHROID) 100 MCG tablet TAKE ONE TABLET BY MOUTH ONCE DAILY 30 tablet 10  . metolazone (ZAROXOLYN) 2.5 MG tablet Take one  tablet by mouth on Mon, Wed, and Fri 15 tablet 1  . nystatin cream (MYCOSTATIN) Apply 1 application topically 2 (two) times daily. Apply to affected area BID 30 g 0  . Omega-3 Fatty Acids (FISH OIL PO) Take 1,000 mg by mouth 3 (three) times daily. 2 caos in am, 1 cap in pm    . triamcinolone cream (KENALOG) 0.1 % Apply 1 application topically 2 (two) times daily. 30 g 1   No current facility-administered medications for this visit.    Allergies:  Celexa; Iohexol; Ivp dye; Phenobarbital; Shellfish allergy; Ciprofloxacin;  Tegretol; Cinobac; Penicillins; and Sulfur   Social History: The patient  reports that she has never smoked. She has never used smokeless tobacco. She reports that she does not drink alcohol or use illicit drugs.   ROS:  Please see the history of present illness. Otherwise, complete review of systems is positive for chronic leg edema.  All other systems are reviewed and negative.   Physical Exam: VS:  BP 129/80 mmHg  Pulse 97  Ht 5\' 6"  (1.676 m)  Wt 170 lb 12.8 oz (77.474 kg)  BMI 27.58 kg/m2  SpO2 97%, BMI Body mass index is 27.58 kg/(m^2).  Wt Readings from Last 3 Encounters:  12/31/14 170 lb 12.8 oz (77.474 kg)  12/17/14 174 lb (78.926 kg)  12/03/14 174 lb 3.2 oz (79.017 kg)     General: No distress. HEENT: Wearing glasses. Conjunctiva and lids normal, oropharynx clear. Neck: Supple, no elevated JVP or carotid bruits, no thyromegaly. Lungs: Clear to auscultation, nonlabored breathing at rest. Cardiac: Regular rate and rhythm, no S3 or significant systolic murmur, no pericardial rub. Abdomen: Soft, nontender, no guarding or rebound. Extremities: Chronic appearing edema that is mainly distal with some evidence of venous stasis, distal pulses 1-2+. Skin: Warm and dry. Mild erythema left anterior shin. Patient states she had an area of cellulitis here after having a mole removed. Musculoskeletal: No kyphosis. Neuropsychiatric: Alert and oriented x3, affect unusual.   ECG: Recent tracing from 12/17/2014 shows sinus rhythm with small R prime in lead V1 and possible left atrial enlargement.   Recent Labwork: 06/17/2014: Platelets 222 08/28/2014: TSH 1.330 10/28/2014: Hemoglobin 12.3 11/12/2014: ALT 20; AST 20; BNP 6.8; BUN 12; Creatinine, Ser 0.74; Potassium 3.8; Sodium 142     Component Value Date/Time   CHOL 262* 06/28/2014 1712   TRIG 140 06/28/2014 1712   HDL 69 06/28/2014 1712   CHOLHDL 3.8 06/28/2014 1712   LDLCALC 165* 06/28/2014 1712    Other Studies Reviewed  Today:  Echocardiogram 07/11/2014: Study Conclusions  - Left ventricle: The cavity size was normal. Wall thickness was normal. Systolic function was normal. The estimated ejection fraction was in the range of 60% to 65%. Wall motion was normal; there were no regional wall motion abnormalities. Doppler parameters are consistent with abnormal left ventricular relaxation (grade 1 diastolic dysfunction). - Aortic valve: Valve area (VTI): 2.86 cm^2. Valve area (Vmax): 2.58 cm^2. - Technically adequate study.   Assessment and Plan:  1. Palpitations as outlined above. Based on available information and testing, no clear evidence of arrhythmia to correspond with symptoms. She did have atrial and ventricular ectopy including a few brief atrial runs which she might be feeling. After discussion today she is inclined to undergo a more prolonged course of cardiac monitoring. We have arranged a 7 day event recorder, she reports essentially daily symptoms, and this should provide an adequate period of time for reassessment. We may not uncover any new  information, however main concern is to exclude any sustained arrhythmias such as atrial tachycardia or fibrillation that would prompt a definite change in medications and/or treatment. On the other hand, if no evidence of arrhythmia is noted, she could be considered for use of an emperic beta blocker to see if this helps with symptoms. We will plan to call her with the results and convey this to Dr. Sabra Heck.  2. Hyperlipidemia, on Lipitor, followed by Dr. Sabra Heck.  3. History of venous insufficiency with previous right leg vein surgery, and chronic leg edema on diuretics.  Current medicines were reviewed with the patient today.   Orders Placed This Encounter  Procedures  . Cardiac event monitor    Disposition: Call with results.   Signed, Satira Sark, MD, [facility] 12/31/2014 5:27 PM    [facility] [place], [place], [place] [address] Phone: [phone]; Fax: [phone]

## 2015-01-03 ENCOUNTER — Ambulatory Visit: Payer: 59 | Admitting: Cardiology

## 2015-01-06 ENCOUNTER — Ambulatory Visit (INDEPENDENT_AMBULATORY_CARE_PROVIDER_SITE_OTHER): Payer: 59

## 2015-01-06 DIAGNOSIS — R002 Palpitations: Secondary | ICD-10-CM

## 2015-01-13 ENCOUNTER — Telehealth: Payer: Self-pay | Admitting: *Deleted

## 2015-01-13 NOTE — Telephone Encounter (Signed)
Patient informed. 

## 2015-01-13 NOTE — Telephone Encounter (Signed)
-----   Message from Satira Sark, MD sent at 01/13/2015 12:27 PM EDT ----- Reviewed strips. Sinus rhythm and sinus tachycardia were noted, however there were no significant arrhythmias, importantly no atrial fibrillation or ventricular arrhythmias noted. PACs were seen, this was documented in the past as well. She may be feeling these ectopic beats, however overall relatively benign. At this point no further cardiac workup is anticipated. She should keep follow-up with Dr. Sabra Heck. Consideration could be given to empiric low-dose beta blocker to see if this helps with symptoms, although not required.

## 2015-01-15 NOTE — Telephone Encounter (Signed)
Based on her recent and previous cardiac monitoring, she does not have any significant cardiac arrhythmias that require further evaluation. PACs are typically benign and fairly common. A beta blocker may be a way to suppress these PACs, although it is not clear whether she would notice any symptomatic improvement or not. I would recommend that she continue to see Dr. Sabra Heck and discuss her ongoing care with him as her primary care provider. She does not require long-term cardiology follow-up for PACs.

## 2015-01-15 NOTE — Telephone Encounter (Signed)
Feet are still swollen and patient wants to discuss Consideration could be given to empiric low-dose beta blocker to see if this helps with symptoms, although not required.  She would like a detailed conversation about this medication and if she should start taking this medication.

## 2015-01-15 NOTE — Telephone Encounter (Signed)
Pt aware and has appointment with Dr. Sabra Heck tomorrow and will discuss beta blocker

## 2015-01-16 ENCOUNTER — Ambulatory Visit (INDEPENDENT_AMBULATORY_CARE_PROVIDER_SITE_OTHER): Payer: 59 | Admitting: Family Medicine

## 2015-01-16 ENCOUNTER — Telehealth: Payer: Self-pay

## 2015-01-16 ENCOUNTER — Encounter: Payer: Self-pay | Admitting: Family Medicine

## 2015-01-16 VITALS — BP 122/78 | HR 105 | Temp 97.1°F | Ht 66.0 in | Wt 166.2 lb

## 2015-01-16 DIAGNOSIS — E785 Hyperlipidemia, unspecified: Secondary | ICD-10-CM | POA: Diagnosis not present

## 2015-01-16 DIAGNOSIS — R002 Palpitations: Secondary | ICD-10-CM

## 2015-01-16 DIAGNOSIS — F411 Generalized anxiety disorder: Secondary | ICD-10-CM | POA: Diagnosis not present

## 2015-01-16 NOTE — Progress Notes (Signed)
   Subjective:    Patient ID: Heather Leblanc, female    DOB: 1950/12/23, 64 y.o.   MRN: [id number]  HPI extent 64-year-old female here to follow-up her event monitor from the cardiologist office. Actually I think she had heard there was no significant abnormalities there were some premature atrial beats but nothing that needed to be treated. She has a list of other symptoms all of which are fairly insignificant. We basically talked about multiple concerns including some numbness in her toes is probably related to swelling some discolored skin on her leg which is probably related to some cellulitis that she had previously. We did decide that she should probably have her lipids repeated since having started the Lipitor and March. She brings a report from 2002 for carotid ultrasound that showed some moderate changes but I told her that she was being treated for her blood pressure and lipids and without symptoms of TIA we really did not do any thing to do further testing such as Doppler.  Patient Active Problem List   Diagnosis Date Noted  . Palpitation 07/10/2014  . Urinary tract infectious disease 01/31/2014  . Hydradenitis 01/31/2014  . Peripheral edema 12/03/2013  . White coat syndrome without hypertension 12/03/2013  . Hypothyroidism 12/08/2012  . GAD (generalized anxiety disorder) 12/08/2012   Outpatient Encounter Prescriptions as of 01/16/2015  Medication Sig  . alprazolam (XANAX) 2 MG tablet Take 1 tablet (2 mg total) by mouth 2 (two) times daily.  Marland Kitchen atorvastatin (LIPITOR) 20 MG tablet Take 20 mg by mouth. Monday & Thursday  . Cholecalciferol (VITAMIN D) 2000 UNITS CAPS Take 2,000 Units by mouth daily.   . furosemide (LASIX) 40 MG tablet Take 40 mg by mouth daily.  Marland Kitchen levothyroxine (SYNTHROID, LEVOTHROID) 100 MCG tablet TAKE ONE TABLET BY MOUTH ONCE DAILY  . metolazone (ZAROXOLYN) 2.5 MG tablet Take one tablet by mouth on Mon, Wed, and Fri  . nystatin cream (MYCOSTATIN) Apply 1 application  topically 2 (two) times daily. Apply to affected area BID  . Omega-3 Fatty Acids (FISH OIL PO) Take 1,000 mg by mouth 3 (three) times daily. 2 caos in am, 1 cap in pm  . triamcinolone cream (KENALOG) 0.1 % Apply 1 application topically 2 (two) times daily.   No facility-administered encounter medications on file as of 01/16/2015.      Review of Systems  Constitutional: Negative.   HENT: Negative.   Respiratory: Negative.   Cardiovascular: Negative.   Genitourinary: Negative.   Neurological: Negative.   Psychiatric/Behavioral: The patient is nervous/anxious.        Objective:   Physical Exam  Constitutional: She appears well-developed and well-nourished.  Cardiovascular: Normal rate and regular rhythm.   Pulmonary/Chest: Effort normal and breath sounds normal.  Skin:  Area on left shin with some brawny discoloration but not scarring. Toes really are not discolored as they have been in the past and there is minimal edema on the dorsum of her feet          Assessment & Plan:  1. Hyperlipidemia LDL was 165 when Lipitor was initiated in March. Need to check again today - Lipid panel  2. GAD (generalized anxiety disorder) Continues with alprazolam 2 mg twice a day  3. Palpitation Hopefully she can be reassured now that any of her palpitations are nothing to worry about. She declined use of beta blocker to diminish perceptions of same.  Wardell Honour MD

## 2015-01-16 NOTE — Telephone Encounter (Signed)
Event monitor is basically normal there are some nonspecific ectopic beats but nothing of real concern or that needs to be treated

## 2015-01-17 LAB — LIPID PANEL
CHOLESTEROL TOTAL: 208 mg/dL — AB (ref 100–199)
Chol/HDL Ratio: 4.2 ratio units (ref 0.0–4.4)
HDL: 50 mg/dL (ref >39)
LDL Calculated: 129 mg/dL — ABNORMAL HIGH (ref 0–99)
Triglycerides: 147 mg/dL (ref 0–149)
VLDL Cholesterol Cal: 29 mg/dL (ref 5–40)

## 2015-01-17 NOTE — Telephone Encounter (Signed)
Patient is requesting screening colonoscopy, prefers [facility]

## 2015-01-20 NOTE — Telephone Encounter (Signed)
Depending on when she had her last colonoscopy okay to schedule

## 2015-02-05 ENCOUNTER — Encounter: Payer: Self-pay | Admitting: Family Medicine

## 2015-02-05 ENCOUNTER — Encounter: Payer: 59 | Admitting: Vascular Surgery

## 2015-02-05 ENCOUNTER — Encounter (HOSPITAL_COMMUNITY): Payer: 59

## 2015-02-05 ENCOUNTER — Ambulatory Visit (INDEPENDENT_AMBULATORY_CARE_PROVIDER_SITE_OTHER): Payer: 59 | Admitting: Family Medicine

## 2015-02-05 VITALS — BP 148/84 | HR 99 | Temp 99.5°F | Ht 66.0 in | Wt 169.8 lb

## 2015-02-05 DIAGNOSIS — R609 Edema, unspecified: Secondary | ICD-10-CM

## 2015-02-05 DIAGNOSIS — F411 Generalized anxiety disorder: Secondary | ICD-10-CM | POA: Diagnosis not present

## 2015-02-05 MED ORDER — FUROSEMIDE 40 MG PO TABS: 40.0000 mg | ORAL_TABLET | Freq: Every day | ORAL | Status: DC

## 2015-02-05 NOTE — Progress Notes (Signed)
   Subjective:    Patient ID: Heather Leblanc, female    DOB: 1950/11/15, 64 y.o.   MRN: [id number]  HPI 64 year old female here to follow-up dependent edema. She is out of her diuretic but the edema does not look bad today. Her weight is up about 4 pounds since her last visit here 3 weeks ago. Blood pressure is also up-to-date but she was laid in in a hurry and 8 could be attributable to that as well as the fact that she has not had her diuretic for the last week. Lipids were checked at her last visit and has been definite improvement in her LDL and total cholesterol. She is seeking referrals today for colonoscopy and dermatology. Regarding dermatology she has not seen a dermatologist. I explained that we could do a skin exam and if anything was suspicious for that could not be handled here would be glad to refer.  Patient Active Problem List   Diagnosis Date Noted  . Palpitation 07/10/2014  . Urinary tract infectious disease 01/31/2014  . Hydradenitis 01/31/2014  . Peripheral edema 12/03/2013  . White coat syndrome without hypertension 12/03/2013  . Hypothyroidism 12/08/2012  . GAD (generalized anxiety disorder) 12/08/2012   Outpatient Encounter Prescriptions as of 02/05/2015  Medication Sig  . alprazolam (XANAX) 2 MG tablet Take 1 tablet (2 mg total) by mouth 2 (two) times daily.  Marland Kitchen atorvastatin (LIPITOR) 20 MG tablet Take 20 mg by mouth. Monday & Thursday  . Cholecalciferol (VITAMIN D) 2000 UNITS CAPS Take 2,000 Units by mouth daily.   . furosemide (LASIX) 40 MG tablet Take 40 mg by mouth daily.  Marland Kitchen levothyroxine (SYNTHROID, LEVOTHROID) 100 MCG tablet TAKE ONE TABLET BY MOUTH ONCE DAILY  . metolazone (ZAROXOLYN) 2.5 MG tablet Take one tablet by mouth on Mon, Wed, and Fri  . nystatin cream (MYCOSTATIN) Apply 1 application topically 2 (two) times daily. Apply to affected area BID  . Omega-3 Fatty Acids (FISH OIL PO) Take 1,000 mg by mouth 3 (three) times daily. 2 caos in am, 1 cap in pm  .  triamcinolone cream (KENALOG) 0.1 % Apply 1 application topically 2 (two) times daily.   No facility-administered encounter medications on file as of 02/05/2015.      Review of Systems  Constitutional: Negative.   Respiratory: Negative.   Cardiovascular: Negative.   Musculoskeletal: Negative.   Psychiatric/Behavioral: Negative.        Objective:   Physical Exam  Constitutional: She appears well-developed and well-nourished.  Cardiovascular: Normal rate and regular rhythm.   Pulmonary/Chest: Effort normal.  Musculoskeletal: She exhibits edema (edema is minimal today in spite of lack of diuretic use).  Skin:  Skin exam done. On her upper back between the scapula there are 2 small keratosis. These are benign. She also has a small roughened area on her for head that probably represents an actinic keratosis. This was frozen with liquid nitrogen.          Assessment & Plan:  1. GAD (generalized anxiety disorder) Symptoms well controlled with alprazolam at current dose  2. Peripheral edema Edema is minimal today but we will refill furosemide to take as needed. Do not think we need to continue with metolazone  Wardell Honour MD  Notice: Thisdictation was prepared with Dragon dictation along with smaller phrase technology. Any transcriptional errors that result from this process are unintentional and may not be corrected upon review

## 2015-02-11 ENCOUNTER — Encounter: Payer: Self-pay | Admitting: Vascular Surgery

## 2015-02-13 ENCOUNTER — Ambulatory Visit (INDEPENDENT_AMBULATORY_CARE_PROVIDER_SITE_OTHER): Payer: 59 | Admitting: Vascular Surgery

## 2015-02-13 ENCOUNTER — Ambulatory Visit (HOSPITAL_COMMUNITY)
Admission: RE | Admit: 2015-02-13 | Discharge: 2015-02-13 | Disposition: A | Discharge status: Home / Self Care | Payer: 59 | Source: Ambulatory Visit | Attending: Vascular Surgery | Admitting: Vascular Surgery

## 2015-02-13 ENCOUNTER — Encounter: Payer: Self-pay | Admitting: Vascular Surgery

## 2015-02-13 VITALS — BP 129/76 | HR 83 | Ht 66.0 in | Wt 169.9 lb

## 2015-02-13 DIAGNOSIS — R609 Edema, unspecified: Secondary | ICD-10-CM

## 2015-02-13 DIAGNOSIS — I83891 Varicose veins of right lower extremities with other complications: Secondary | ICD-10-CM | POA: Diagnosis not present

## 2015-02-13 DIAGNOSIS — R600 Localized edema: Secondary | ICD-10-CM | POA: Diagnosis present

## 2015-02-13 DIAGNOSIS — M7989 Other specified soft tissue disorders: Secondary | ICD-10-CM | POA: Diagnosis not present

## 2015-02-13 NOTE — Progress Notes (Signed)
[facility] [place] HISTORY AND PHYSICAL   History of Present Illness:  Patient is a 64 y.o. year old female who presents for evaluation of bilateral leg swelling, right greater than left.   The patient states she has had a several month history dating back to March of this year problem with lower extremity swelling. She also has gained 30 pounds. She denies pain in her lower extremities. She has intermittently worn some " medium compression stockings". She did have a right greater saphenous ablation by Dr. Michae Kava in January 2016. She denies prior history of DVT but states she did have a superficial venous thrombosis. However, from her history is sounds like it may have been related to her venous ablation.  She also complains of intermittent numbness and tingling in her toes.She is also concerned about abdominal swelling.  Other medical problems include anxiety depression and hypothyroidism all of which are currently stable.  Past Medical History  Diagnosis Date  . Hypothyroidism   . Anxiety   . Eczema   . Cystitis   . Premature menopause   . History of phlebitis   . 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 History of seasonal allergies   . 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 Depression   . Cancer [facility])     skin    Past Surgical History  Procedure Laterality Date  . Right leg vein surgery      Social History Social History  Substance Use Topics  . Smoking status: Never Smoker   . Smokeless tobacco: Never Used  . Alcohol Use: No    Family History Family History  Problem Relation Age of Onset  . Heart disease Mother     Valvular, RHD  . Varicose Veins Mother   . CAD Father 80    Allergies  Allergies  Allergen Reactions  . Celexa [Citalopram Hydrobromide] Other (See Comments)    Panic atacks, palpitations, anxiety  . Iohexol Other (See Comments)    Chest pressure, shortness of breath, cardiac arrest   . Ivp Dye [Iodinated Diagnostic Agents] Other (See  Comments)    Chest pressure, shortness of breath, cardiac arrest  . Phenobarbital Other (See Comments)    Chest pressure, shortness of breath, cardiac arrest  . Shellfish Allergy Shortness Of Breath and Swelling  . Ciprofloxacin Other (See Comments)    Doesn't work for UTI  . Tegretol [Carbamazepine] Other (See Comments)    Flu like symptoms  . Cinobac [Cinoxacin] Other (See Comments)    unknown  . Penicillins Other (See Comments)    unknown  . Sulfur Rash     Current Outpatient Prescriptions  Medication Sig Dispense Refill  . alprazolam (XANAX) 2 MG tablet Take 1 tablet (2 mg total) by mouth 2 (two) times daily. 60 tablet 5  . atorvastatin (LIPITOR) 20 MG tablet Take 20 mg by mouth. Monday & Thursday    . Cholecalciferol (VITAMIN D) 2000 UNITS CAPS Take 2,000 Units by mouth daily.     . furosemide (LASIX) 40 MG tablet Take 1 tablet (40 mg total) by mouth daily. 30 tablet 0  . levothyroxine (SYNTHROID, LEVOTHROID) 100 MCG tablet TAKE ONE TABLET BY MOUTH ONCE DAILY 30 tablet 10  . Omega-3 Fatty Acids (FISH OIL PO) Take 1,000 mg by mouth 3 (three) times daily. 2 caos in am, 1 cap in pm    . metolazone (ZAROXOLYN) 2.5 MG tablet Take one tablet by mouth on Mon, Wed,  and Fri (Patient not taking: Reported on 02/13/2015) 15 tablet 1  . nystatin cream (MYCOSTATIN) Apply 1 application topically 2 (two) times daily. Apply to affected area BID (Patient not taking: Reported on 02/13/2015) 30 g 0  . triamcinolone cream (KENALOG) 0.1 % Apply 1 application topically 2 (two) times daily. (Patient not taking: Reported on 02/13/2015) 30 g 1   No current facility-administered medications for this visit.    ROS:   General:  No weight loss, Fever, chills  HEENT: No recent headaches, no nasal bleeding, no visual changes, no sore throat  Neurologic: No dizziness, blackouts, seizures. No recent symptoms of stroke or mini- stroke. No recent episodes of slurred speech, or temporary blindness.  Cardiac: No  recent episodes of chest pain/pressure, no shortness of breath at rest.  No shortness of breath with exertion.  Denies history of atrial fibrillation or irregular heartbeat.   Does have occasional palpitations.  Vascular: No history of rest pain in feet.  No history of claudication.  No history of non-healing ulcer, No history of DVT   Pulmonary: No home oxygen,  She does have a chronic cough, no hemoptysis,  No asthma or wheezing  Musculoskeletal:  [ ]  Arthritis, [ ]  Low back pain,  [ ]  Joint pain  Hematologic:No history of hypercoagulable state.  No history of easy bleeding.  No history of anemia  Gastrointestinal: No hematochezia or melena,  No gastroesophageal reflux, no trouble swallowing  Urinary: [ ]  chronic Kidney disease, [ ]  on HD - [ ]  MWF or [ ]  TTHS, [ ]  Burning with urination, [ ]  Frequent urination, [ ]  Difficulty urinating;   Skin: No rashes  Psychological: No history of anxiety,  No history of depression   Physical Examination  Filed Vitals:   02/13/15 1105  BP: 129/76  Pulse: 83  Height: 5\' 6"  (1.676 m)  Weight: 169 lb 14.4 oz (77.066 kg)  SpO2: 98%    Body mass index is 27.44 kg/(m^2).  General:  Alert and oriented, no acute distress HEENT: Normal Neck: No bruit or JVD Pulmonary: Clear to auscultation bilaterally Cardiac: Regular Rate and Rhythm without murmur Abdomen: Soft, non-tender, non-distended, no mass Skin: No rash,  scattered spider-type varicosities thigh and leg bilaterally Extremity Pulses:  2+ radial, brachial, femoral, dorsalis pedis, posterior tibial pulses bilaterally Musculoskeletal: No deformity or edema  Neurologic: Upper and lower extremity motor 5/5 and symmetric  DATA:   Patient had a venous duplex exam today which showed mild reflux in the common femoral vein otherwise her deep system was normal bilaterally. She did have evidence of her right greater saphenous vein ablation. She had no superficial venous reflux.   ASSESSMENT:    Patient with chronic leg swelling which seems to have improved with diuretics. She does not have a remarkable venous duplex exam. Overall this was a fairly normal exam. She did have some spider-type varicosities in her legs which could be treated with sclerotherapy but I do not believe explains her overall swelling symptoms. I encouraged her to continue follow-up with her primary care physician regarding her leg swelling and reassured her that it seems all avenues or etiology for this are being ruled out. She will also continue follow-up with her primary care physician regarding her chronic cough.   PLAN:   See above; follow-up on as-needed basis.  Ruta Hinds, MD [facility] [place] Office: [phone] Pager: [phone]

## 2015-02-25 ENCOUNTER — Ambulatory Visit (INDEPENDENT_AMBULATORY_CARE_PROVIDER_SITE_OTHER): Payer: 59 | Admitting: Family Medicine

## 2015-02-25 ENCOUNTER — Encounter: Payer: Self-pay | Admitting: Family Medicine

## 2015-02-25 VITALS — BP 123/79 | HR 100 | Temp 98.7°F | Ht 66.0 in | Wt 169.0 lb

## 2015-02-25 DIAGNOSIS — R002 Palpitations: Secondary | ICD-10-CM

## 2015-02-25 DIAGNOSIS — R609 Edema, unspecified: Secondary | ICD-10-CM

## 2015-02-25 DIAGNOSIS — F411 Generalized anxiety disorder: Secondary | ICD-10-CM | POA: Diagnosis not present

## 2015-02-25 MED ORDER — ALPRAZOLAM 2 MG PO TABS: 2.0000 mg | ORAL_TABLET | Freq: Two times a day (BID) | ORAL | Status: DC

## 2015-02-25 MED ORDER — FUROSEMIDE 40 MG PO TABS: 40.0000 mg | ORAL_TABLET | Freq: Every day | ORAL | Status: DC

## 2015-02-25 NOTE — Progress Notes (Signed)
   Subjective:    Patient ID: Heather Leblanc, female    DOB: 1950-08-29, 64 y.o.   MRN: [id number]  HPI 64 year old female is here to follow-up palpitations, peripheral edema, generalized anxiety disorder. She continues to worry about her heart palpitations and. This limits her physical activity as well as participation  In other activities such as Bible study. Also discussed her edema. She saw vascular surgeon recently who did Dopplers and told her she had no significant venous disease. Her edema is 3 well controlled on Lasix and metolazone and she desires to stay on those medicines indefinitely  Patient Active Problem List   Diagnosis Date Noted  . Palpitation 07/10/2014  . Urinary tract infectious disease 01/31/2014  . Hydradenitis 01/31/2014  . Peripheral edema 12/03/2013  . White coat syndrome without hypertension 12/03/2013  . Hypothyroidism 12/08/2012  . GAD (generalized anxiety disorder) 12/08/2012   Outpatient Encounter Prescriptions as of 02/25/2015  Medication Sig  . alprazolam (XANAX) 2 MG tablet Take 1 tablet (2 mg total) by mouth 2 (two) times daily.  Marland Kitchen atorvastatin (LIPITOR) 20 MG tablet Take 20 mg by mouth. Monday & Thursday  . Cholecalciferol (VITAMIN D) 2000 UNITS CAPS Take 2,000 Units by mouth daily.   . furosemide (LASIX) 40 MG tablet Take 1 tablet (40 mg total) by mouth daily.  Marland Kitchen levothyroxine (SYNTHROID, LEVOTHROID) 100 MCG tablet TAKE ONE TABLET BY MOUTH ONCE DAILY  . metolazone (ZAROXOLYN) 2.5 MG tablet Take one tablet by mouth on Mon, Wed, and Fri  . nystatin cream (MYCOSTATIN) Apply 1 application topically 2 (two) times daily. Apply to affected area BID  . Omega-3 Fatty Acids (FISH OIL PO) Take 1,000 mg by mouth 3 (three) times daily. 2 caos in am, 1 cap in pm  . triamcinolone cream (KENALOG) 0.1 % Apply 1 application topically 2 (two) times daily.   No facility-administered encounter medications on file as of 02/25/2015.      Review of Systems    Constitutional: Negative.   Respiratory: Negative.   Cardiovascular: Positive for palpitations.  Psychiatric/Behavioral: The patient is nervous/anxious.        Objective:   Physical Exam  Constitutional: She appears well-developed and well-nourished.  Cardiovascular: Normal rate and regular rhythm.   Pulmonary/Chest: Effort normal and breath sounds normal.  Musculoskeletal: Edema: edema is 1-2+  Psychiatric: She has a normal mood and affect.          Assessment & Plan:  1. GAD (generalized anxiety disorder) Xanax as before  2. Palpitation Reassure. Patient does have some premature atrial contractions. These are totally benign and should not limit her activity level  3. Peripheral edema Continue with furosemide and metolazone  Wardell Honour MD

## 2015-02-26 ENCOUNTER — Ambulatory Visit (AMBULATORY_SURGERY_CENTER): Payer: Self-pay | Admitting: *Deleted

## 2015-02-26 VITALS — Ht 66.0 in | Wt 168.8 lb

## 2015-02-26 DIAGNOSIS — Z1211 Encounter for screening for malignant neoplasm of colon: Secondary | ICD-10-CM

## 2015-02-26 NOTE — Progress Notes (Signed)
No allergies to eggs or soy. No problems with anesthesia.  Pt given Emmi instructions for colonoscopy  No oxygen use  No diet drug use  

## 2015-03-12 ENCOUNTER — Encounter: Payer: Self-pay | Admitting: Gastroenterology

## 2015-03-17 ENCOUNTER — Telehealth: Payer: Self-pay | Admitting: Gastroenterology

## 2015-03-17 NOTE — Telephone Encounter (Signed)
No charge. 

## 2015-03-19 ENCOUNTER — Encounter: Payer: 59 | Admitting: Gastroenterology

## 2015-04-02 ENCOUNTER — Ambulatory Visit (INDEPENDENT_AMBULATORY_CARE_PROVIDER_SITE_OTHER): Payer: 59 | Admitting: Family Medicine

## 2015-04-02 ENCOUNTER — Encounter: Payer: Self-pay | Admitting: Family Medicine

## 2015-04-02 VITALS — BP 139/82 | HR 86 | Temp 97.3°F | Ht 66.0 in | Wt 171.2 lb

## 2015-04-02 DIAGNOSIS — E039 Hypothyroidism, unspecified: Secondary | ICD-10-CM | POA: Diagnosis not present

## 2015-04-02 DIAGNOSIS — F411 Generalized anxiety disorder: Secondary | ICD-10-CM | POA: Diagnosis not present

## 2015-04-02 DIAGNOSIS — R002 Palpitations: Secondary | ICD-10-CM | POA: Diagnosis not present

## 2015-04-02 MED ORDER — ALPRAZOLAM 2 MG PO TABS: 2.0000 mg | ORAL_TABLET | Freq: Two times a day (BID) | ORAL | Status: DC

## 2015-04-02 MED ORDER — ATENOLOL 25 MG PO TABS: 25.0000 mg | ORAL_TABLET | Freq: Every day | ORAL | Status: DC

## 2015-04-02 NOTE — Progress Notes (Signed)
   Subjective:    Patient ID: Heather Leblanc, female    DOB: [birth year], 65 y.o.   MRN: [id number]  HPI 64 year old female here for follow-up. Symptoms recently have been related to anxiety, palpitations, and dependent edema. I think the anxiety is pretty well controlled as Xanax 2 mg twice a day. Regarding palpitations she has had several studies including event monitors which showed some PACs but nothing of significance. She continues to do well on symptoms and we spent some time today discussing use of beta blockers and how that might diminish her perception of these symptoms. Her dependent edema is generally improved there is still some present in the symptoms are controlled with diuretic which she takes as needed.    Review of Systems  Constitutional: Negative.   Respiratory: Negative.   Cardiovascular: Positive for palpitations and leg swelling.  Genitourinary: Negative.   Neurological: Negative.   Psychiatric/Behavioral: The patient is nervous/anxious.       BP 139/82 mmHg  Pulse 86  Temp(Src) 97.3 F (36.3 C) (Oral)  Ht 5\' 6"  (1.676 m)  Wt 171 lb 4 oz (77.678 kg)  BMI 27.65 kg/m2  Objective:   Physical Exam  Constitutional: She is oriented to person, place, and time. She appears well-developed and well-nourished.  Cardiovascular: Normal rate, regular rhythm and normal heart sounds.   Pulmonary/Chest: Effort normal and breath sounds normal.  Neurological: She is alert and oriented to person, place, and time.  Psychiatric: She has a normal mood and affect. Her behavior is normal.          Assessment & Plan:  1. GAD (generalized anxiety disorder)  Xanax 2 mg twice a day  2. Palpitation Begin atenolol 25 mg take 1 or 2 at same time every day. Reassess impact on palpitations in 1-2 months  3. Hypothyroidism, unspecified hypothyroidism type TSH is within the therapeutic range no changes needed  Wardell Honour MD

## 2015-04-17 ENCOUNTER — Ambulatory Visit (INDEPENDENT_AMBULATORY_CARE_PROVIDER_SITE_OTHER): Payer: BLUE CROSS/BLUE SHIELD | Admitting: Family Medicine

## 2015-04-17 ENCOUNTER — Encounter: Payer: Self-pay | Admitting: Family Medicine

## 2015-04-17 VITALS — BP 135/87 | HR 73 | Temp 97.7°F | Ht 66.0 in | Wt 179.2 lb

## 2015-04-17 DIAGNOSIS — E039 Hypothyroidism, unspecified: Secondary | ICD-10-CM

## 2015-04-17 DIAGNOSIS — R609 Edema, unspecified: Secondary | ICD-10-CM

## 2015-04-17 DIAGNOSIS — F411 Generalized anxiety disorder: Secondary | ICD-10-CM

## 2015-04-17 DIAGNOSIS — R002 Palpitations: Secondary | ICD-10-CM

## 2015-04-17 MED ORDER — ATENOLOL 50 MG PO TABS: 50.0000 mg | ORAL_TABLET | Freq: Every day | ORAL | Status: DC

## 2015-04-17 NOTE — Progress Notes (Signed)
   Subjective:    Patient ID: Heather Leblanc, female    DOB: [birth year], 65 y.o.   MRN: [id number]  HPI 65 year old female who is here to follow-up palpitations and anxiety. 3 weeks ago she was started on atenolol. She reports an improvement in her palpitations blood pressures have remained the same. She has had some weight gain but I think it is probably more dietary. She frequently cannot afford foods and gets whatever is available through indigent care. This often is heavily slanted toward carbohydrate intake. She has had no dependent edema to speak of. She was started on statins therapy in August and LDL has come down to less than 1:30.  Patient Active Problem List   Diagnosis Date Noted  . Palpitation 07/10/2014  . Urinary tract infectious disease 01/31/2014  . Hydradenitis 01/31/2014  . Peripheral edema 12/03/2013  . White coat syndrome without hypertension 12/03/2013  . Hypothyroidism 12/08/2012  . GAD (generalized anxiety disorder) 12/08/2012   Outpatient Encounter Prescriptions as of 04/17/2015  Medication Sig  . alprazolam (XANAX) 2 MG tablet Take 1 tablet (2 mg total) by mouth 2 (two) times daily.  Marland Kitchen atenolol (TENORMIN) 25 MG tablet Take 1 tablet (25 mg total) by mouth daily.  Marland Kitchen atorvastatin (LIPITOR) 20 MG tablet Take 20 mg by mouth. Monday & Thursday  . Bisacodyl (DULCOLAX PO) Take by mouth. Dulcolax as directed for colonoscopy prep  . Cholecalciferol (VITAMIN D) 2000 UNITS CAPS Take 2,000 Units by mouth daily.   . furosemide (LASIX) 40 MG tablet Take 1 tablet (40 mg total) by mouth daily.  Marland Kitchen levothyroxine (SYNTHROID, LEVOTHROID) 100 MCG tablet TAKE ONE TABLET BY MOUTH ONCE DAILY  . nystatin cream (MYCOSTATIN) Apply 1 application topically 2 (two) times daily. Apply to affected area BID  . Omega-3 Fatty Acids (FISH OIL PO) Take 1,000 mg by mouth 3 (three) times daily. 2 caos in am, 1 cap in pm  . Polyethylene Glycol 3350 (MIRALAX PO) Take by mouth as directed. Miralax 238 Grams as  directed for colonoscopy prep  . triamcinolone cream (KENALOG) 0.1 % Apply 1 application topically 2 (two) times daily.   No facility-administered encounter medications on file as of 04/17/2015.    \  Review of Systems  Constitutional: Positive for unexpected weight change.  HENT: Negative.   Respiratory: Negative.   Cardiovascular: Positive for palpitations.  Psychiatric/Behavioral: The patient is nervous/anxious.        Objective:   Physical Exam  Constitutional: She appears well-developed and well-nourished.  Cardiovascular: Normal rate, regular rhythm and normal heart sounds.   Pulmonary/Chest: Effort normal and breath sounds normal.  Musculoskeletal:  Right shoulder pain. It localizes to the anterior bicipital tendon area. There is decreased range of motion especially abduction.  Area of maximal tenderness over the bicipital tendon was injected with combination Marcaine and Depo-Medrol, 80 mg.          Assessment & Plan:  1. Hypothyroidism, unspecified hypothyroidism type CH was normal at last check in May of last year. We'll plan to repeat at one-year anniversary  2. GAD (generalized anxiety disorder) Continue with alprazolam 2 mg twice a day  3. Peripheral edema This is well managed with furosemide.   4. Palpitations This has been proven to be PACs and patient has been reassured. Since her still some perception of symptoms, will increase atenolol to 50 mg a day and monitor blood pressure and heart rate.  Wardell Honour MD

## 2015-04-17 NOTE — Addendum Note (Signed)
Addended by: Jamelle Haring on: 04/17/2015 02:34 PM   Modules accepted: Orders

## 2015-04-23 ENCOUNTER — Encounter: Payer: Self-pay | Admitting: Family Medicine

## 2015-04-23 ENCOUNTER — Ambulatory Visit (INDEPENDENT_AMBULATORY_CARE_PROVIDER_SITE_OTHER): Payer: BLUE CROSS/BLUE SHIELD | Admitting: Family Medicine

## 2015-04-23 VITALS — BP 130/76 | HR 78 | Temp 97.4°F | Ht 66.0 in | Wt 176.0 lb

## 2015-04-23 DIAGNOSIS — F411 Generalized anxiety disorder: Secondary | ICD-10-CM

## 2015-04-23 DIAGNOSIS — R002 Palpitations: Secondary | ICD-10-CM

## 2015-04-23 MED ORDER — ALPRAZOLAM 2 MG PO TABS: ORAL_TABLET | ORAL | Status: DC

## 2015-04-23 NOTE — Progress Notes (Signed)
   Subjective:    Patient ID: Heather Leblanc, female    DOB: [birth year], 65 y.o.   MRN: [id number]  HPI 65 year old female who is here to follow-up palpitations. At her visit about a week ago we increased atenolol from 25-50 mg. She reports improved symptoms and her blood pressure and pulse are still at good levels.  She has reported death of 3 friends recently and she is having more anxiety. She worries a great deal about her bills and income. We had discussed before increasing her Xanax to 3 times a day but suggested that it would be good to hold off as long as we could. Today she is requesting we increase the frequency.  Patient Active Problem List   Diagnosis Date Noted  . Palpitations 04/17/2015  . Palpitation 07/10/2014  . Urinary tract infectious disease 01/31/2014  . Hydradenitis 01/31/2014  . Peripheral edema 12/03/2013  . White coat syndrome without hypertension 12/03/2013  . Hypothyroidism 12/08/2012  . GAD (generalized anxiety disorder) 12/08/2012   Outpatient Encounter Prescriptions as of 04/23/2015  Medication Sig  . alprazolam (XANAX) 2 MG tablet Take 1 tablet (2 mg total) by mouth 2 (two) times daily.  Marland Kitchen atenolol (TENORMIN) 50 MG tablet Take 1 tablet (50 mg total) by mouth daily.  Marland Kitchen atorvastatin (LIPITOR) 20 MG tablet Take 20 mg by mouth. Monday & Thursday  . Bisacodyl (DULCOLAX PO) Take by mouth. Dulcolax as directed for colonoscopy prep  . Cholecalciferol (VITAMIN D) 2000 UNITS CAPS Take 2,000 Units by mouth daily.   . furosemide (LASIX) 40 MG tablet Take 1 tablet (40 mg total) by mouth daily.  Marland Kitchen levothyroxine (SYNTHROID, LEVOTHROID) 100 MCG tablet TAKE ONE TABLET BY MOUTH ONCE DAILY  . nystatin cream (MYCOSTATIN) Apply 1 application topically 2 (two) times daily. Apply to affected area BID  . Omega-3 Fatty Acids (FISH OIL PO) Take 1,000 mg by mouth 3 (three) times daily. 2 caos in am, 1 cap in pm  . Polyethylene Glycol 3350 (MIRALAX PO) Take by mouth as directed. Miralax  238 Grams as directed for colonoscopy prep  . triamcinolone cream (KENALOG) 0.1 % Apply 1 application topically 2 (two) times daily.   No facility-administered encounter medications on file as of 04/23/2015.      Review of Systems  Constitutional: Negative.   HENT: Negative.   Respiratory: Negative.   Cardiovascular: Positive for palpitations.  Neurological: Negative.        Objective:   Physical Exam  Constitutional: She appears well-developed and well-nourished.  Cardiovascular: Normal rate, regular rhythm and normal heart sounds.   Pulmonary/Chest: Effort normal and breath sounds normal.  Psychiatric: She has a normal mood and affect.          Assessment & Plan:  1. GAD (generalized anxiety disorder) Will increase Xanax to 3 times a day but only having 1 mg in the middle of the day. New Rx for 75 pills, 2 mg  2. Palpitation Will split doses of atenolol to 25 mg twice a day for more even 24-hour coverage. Recheck in one month  Wardell Honour MD

## 2015-05-29 ENCOUNTER — Encounter: Payer: Self-pay | Admitting: Family Medicine

## 2015-05-29 ENCOUNTER — Ambulatory Visit (INDEPENDENT_AMBULATORY_CARE_PROVIDER_SITE_OTHER): Payer: BLUE CROSS/BLUE SHIELD | Admitting: Family Medicine

## 2015-05-29 VITALS — BP 145/75 | HR 76 | Temp 98.0°F | Ht 66.0 in | Wt 191.6 lb

## 2015-05-29 DIAGNOSIS — R002 Palpitations: Secondary | ICD-10-CM

## 2015-05-29 DIAGNOSIS — F411 Generalized anxiety disorder: Secondary | ICD-10-CM | POA: Diagnosis not present

## 2015-05-29 DIAGNOSIS — E039 Hypothyroidism, unspecified: Secondary | ICD-10-CM

## 2015-05-29 MED ORDER — ATENOLOL 50 MG PO TABS: ORAL_TABLET | ORAL | Status: DC

## 2015-05-29 NOTE — Progress Notes (Signed)
   Subjective:    Patient ID: Heather Leblanc, female    DOB: 13-May-1950, 65 y.o.   MRN: [id number]  HPI 65 year old female who is here to follow-up blood pressure anxiety palpitations. Generally palpitations are improved on atenolol. She still has some breakthrough. Today she has gained 15 pounds in the last month. Big part of this is her diet. She depends on what people donate the food pantry and is largely starchy with lots potatoes. She is interested in exercising in a pool which she will be eligible for in April when she gets medical care. I think her anxieties are pretty well controlled. Concomitant with her weight gain her blood pressure has gone up some but I really stressed importance of maintaining weight and now losing some weight.  Patient Active Problem List   Diagnosis Date Noted  . Palpitations 04/17/2015  . Palpitation 07/10/2014  . Urinary tract infectious disease 01/31/2014  . Hydradenitis 01/31/2014  . Peripheral edema 12/03/2013  . White coat syndrome without hypertension 12/03/2013  . Hypothyroidism 12/08/2012  . GAD (generalized anxiety disorder) 12/08/2012   Outpatient Encounter Prescriptions as of 05/29/2015  Medication Sig  . alprazolam (XANAX) 2 MG tablet Take 2 mg in a.m., 1 mg in afternoon, and 2 mg in p.m.  Marland Kitchen atenolol (TENORMIN) 50 MG tablet Take 1 tablet (50 mg total) by mouth daily.  Marland Kitchen atorvastatin (LIPITOR) 20 MG tablet Take 20 mg by mouth. Monday & Thursday  . Bisacodyl (DULCOLAX PO) Take by mouth. Dulcolax as directed for colonoscopy prep  . Cholecalciferol (VITAMIN D) 2000 UNITS CAPS Take 2,000 Units by mouth daily.   . furosemide (LASIX) 40 MG tablet Take 1 tablet (40 mg total) by mouth daily.  Marland Kitchen levothyroxine (SYNTHROID, LEVOTHROID) 100 MCG tablet TAKE ONE TABLET BY MOUTH ONCE DAILY  . nystatin cream (MYCOSTATIN) Apply 1 application topically 2 (two) times daily. Apply to affected area BID  . Omega-3 Fatty Acids (FISH OIL PO) Take 1,000 mg by mouth 3  (three) times daily. 2 caos in am, 1 cap in pm  . Polyethylene Glycol 3350 (MIRALAX PO) Take by mouth as directed. Miralax 238 Grams as directed for colonoscopy prep  . triamcinolone cream (KENALOG) 0.1 % Apply 1 application topically 2 (two) times daily.   No facility-administered encounter medications on file as of 05/29/2015.      Review of Systems  Constitutional: Positive for unexpected weight change.  HENT: Negative.   Respiratory: Negative.   Cardiovascular: Positive for palpitations.  Psychiatric/Behavioral: The patient is nervous/anxious.        Objective:   Physical Exam  Constitutional: She is oriented to person, place, and time. She appears well-developed and well-nourished.  Cardiovascular: Normal rate, regular rhythm and normal heart sounds.   Pulmonary/Chest: Effort normal and breath sounds normal.  Abdominal: Soft. She exhibits no distension. There is no tenderness.  Neurological: She is alert and oriented to person, place, and time.          Assessment & Plan:  1. Hypothyroidism, unspecified hypothyroidism type TSH was at goal. Plan to repeat it at next visit  2. GAD (generalized anxiety disorder) Alprazolam really helps. We'll continue it 2 mg in a.m. and p.m. and 1 mg in afternoon  3. Palpitations Will increase atenolol to 25 mg area now she will take 50 mg in the morning and 20 5 at night. This may help lower blood pressures on as well  Wardell Honour MD

## 2015-06-05 ENCOUNTER — Encounter: Payer: Self-pay | Admitting: Family Medicine

## 2015-06-09 ENCOUNTER — Telehealth: Payer: Self-pay | Admitting: Family Medicine

## 2015-06-10 ENCOUNTER — Telehealth: Payer: Self-pay | Admitting: *Deleted

## 2015-06-10 NOTE — Telephone Encounter (Signed)
Patient came by today complaining with being dizzy. She states that she stopped at the fire department last night because she was dizzy. Patient's Bp was 102/71 p 75 today. Patient advised that we can get her in tomorrow or she may try the urgent care tonight.

## 2015-06-10 NOTE — Telephone Encounter (Signed)
I think office visit would be indicated prior to ordering any brain scans

## 2015-06-11 ENCOUNTER — Ambulatory Visit (INDEPENDENT_AMBULATORY_CARE_PROVIDER_SITE_OTHER): Payer: BLUE CROSS/BLUE SHIELD | Admitting: Family Medicine

## 2015-06-11 ENCOUNTER — Encounter: Payer: Self-pay | Admitting: Family Medicine

## 2015-06-11 VITALS — BP 96/62 | HR 79 | Temp 97.5°F | Ht 66.0 in | Wt 185.2 lb

## 2015-06-11 DIAGNOSIS — R42 Dizziness and giddiness: Secondary | ICD-10-CM | POA: Diagnosis not present

## 2015-06-11 NOTE — Progress Notes (Signed)
   HPI  Patient presents today for evaluation of dizziness. Patient's plans over the last 3 days she's had one major episode of dizziness. She states that she was driving at the time and because she felt unusual she pulled over a fire station. She got out and began talking to the fireman when she had an episode of unsteadiness. Her blood pressure was 120/70 at that time, she had what appeared glucose that was 96. She recovered after a few minutes. She did not have room spinning sensation Since that time and has not reoccurred, she has not had more palpitations than usual.  She states that her blood pressure 1 day ago was 102/71 and pulse 75 She states that her dizziness is mostly perceived as unsteadiness, however has not been persistent during this time.  She is taking Lasix 3 days consecutively due to leg swelling. She has not been wearing compression stockings and has been sleeoping in a chair with her feet down  No chest pain, headache No palps  Recent eval for Palps by EP with negative 1 week holter  PMH: Smoking status noted ROS: Per HPI  Objective: BP 96/62 mmHg  Pulse 79  Temp(Src) 97.5 F (36.4 C) (Oral)  Ht 5\' 6"  (1.676 m)  Wt 185 lb 3.2 oz (84.006 kg)  BMI 29.91 kg/m2 Gen: NAD, alert, cooperative with exam HEENT: NCAT, EOMI, PERRLA CV: RRR, good S1/S2, no murmur Resp: CTABL, no wheezes, non-labored Ext: No edema, warm Neuro: Alert and oriented, Strength 5/5 and sensatiuon intact in all 4 extrems, CN 2-12 intact, normal gait  Assessment and plan:  # Dizziness I feel its likely due to volume depletion and relative hypotension No symps currently, Neuro exam is normal and recent cardiac eval was thorough and normal Decrease lasix use, limit to 2 pills weekly, wear compression stockings and elevate feet RTC with any concerns or worsening symps   Heather Apple, MD [facility] 06/11/2015, 11:17 AM

## 2015-06-11 NOTE — Patient Instructions (Signed)
Great to meet you!  Try to stop the lasix, limit yourself to no more than 2 pill per week  Start wearing compression stockings and elevate your legs when possible  Dizziness Dizziness is a common problem. It is a feeling of unsteadiness or light-headedness. You may feel like you are about to faint. Dizziness can lead to injury if you stumble or fall. Anyone can become dizzy, but dizziness is more common in older adults. This condition can be caused by a number of things, including medicines, dehydration, or illness. HOME CARE INSTRUCTIONS Taking these steps may help with your condition: Eating and Drinking  Drink enough fluid to keep your urine clear or pale yellow. This helps to keep you from becoming dehydrated. Try to drink more clear fluids, such as water.  Do not drink alcohol.  Limit your caffeine intake if directed by your health care provider.  Limit your salt intake if directed by your health care provider. Activity  Avoid making quick movements.  Rise slowly from chairs and steady yourself until you feel okay.  In the morning, first sit up on the side of the bed. When you feel okay, stand slowly while you hold onto something until you know that your balance is fine.  Move your legs often if you need to stand in one place for a long time. Tighten and relax your muscles in your legs while you are standing.  Do not drive or operate heavy machinery if you feel dizzy.  Avoid bending down if you feel dizzy. Place items in your home so that they are easy for you to reach without leaning over. Lifestyle  Do not use any tobacco products, including cigarettes, chewing tobacco, or electronic cigarettes. If you need help quitting, ask your health care provider.  Try to reduce your stress level, such as with yoga or meditation. Talk with your health care provider if you need help. General Instructions  Watch your dizziness for any changes.  Take medicines only as directed by  your health care provider. Talk with your health care provider if you think that your dizziness is caused by a medicine that you are taking.  Tell a friend or a family member that you are feeling dizzy. If he or she notices any changes in your behavior, have this person call your health care provider.  Keep all follow-up visits as directed by your health care provider. This is important. SEEK MEDICAL CARE IF:  Your dizziness does not go away.  Your dizziness or light-headedness gets worse.  You feel nauseous.  You have reduced hearing.  You have new symptoms.  You are unsteady on your feet or you feel like the room is spinning. SEEK IMMEDIATE MEDICAL CARE IF:  You vomit or have diarrhea and are unable to eat or drink anything.  You have problems talking, walking, swallowing, or using your arms, hands, or legs.  You feel generally weak.  You are not thinking clearly or you have trouble forming sentences. It may take a friend or family member to notice this.  You have chest pain, abdominal pain, shortness of breath, or sweating.  Your vision changes.  You notice any bleeding.  You have a headache.  You have neck pain or a stiff neck.  You have a fever.   This information is not intended to replace advice given to you by your health care provider. Make sure you discuss any questions you have with your health care provider.   Document Released: 09/22/2000  Document Revised: 08/13/2014 Document Reviewed: 03/25/2014 Elsevier Interactive Patient Education [employer].

## 2015-06-12 LAB — CMP14+EGFR
ALBUMIN: 4.3 g/dL (ref 3.6–4.8)
ALT: 23 IU/L (ref 0–32)
AST: 20 IU/L (ref 0–40)
Albumin/Globulin Ratio: 1.6 (ref 1.1–2.5)
Alkaline Phosphatase: 80 IU/L (ref 39–117)
BUN / CREAT RATIO: 21 (ref 11–26)
BUN: 18 mg/dL (ref 8–27)
Bilirubin Total: 0.6 mg/dL (ref 0.0–1.2)
CALCIUM: 9.1 mg/dL (ref 8.7–10.3)
CO2: 26 mmol/L (ref 18–29)
CREATININE: 0.85 mg/dL (ref 0.57–1.00)
Chloride: 96 mmol/L (ref 96–106)
GFR calc Af Amer: 84 mL/min/{1.73_m2} (ref >59)
GFR, EST NON AFRICAN AMERICAN: 73 mL/min/{1.73_m2} (ref >59)
GLOBULIN, TOTAL: 2.7 g/dL (ref 1.5–4.5)
Glucose: 126 mg/dL — ABNORMAL HIGH (ref 65–99)
Potassium: 4.4 mmol/L (ref 3.5–5.2)
SODIUM: 138 mmol/L (ref 134–144)
TOTAL PROTEIN: 7 g/dL (ref 6.0–8.5)

## 2015-06-12 LAB — CBC WITH DIFFERENTIAL/PLATELET
BASOS: 1 %
Basophils Absolute: 0.1 10*3/uL (ref 0.0–0.2)
EOS (ABSOLUTE): 0.3 10*3/uL (ref 0.0–0.4)
EOS: 3 %
HEMATOCRIT: 38.5 % (ref 34.0–46.6)
HEMOGLOBIN: 12.7 g/dL (ref 11.1–15.9)
IMMATURE GRANULOCYTES: 0 %
Immature Grans (Abs): 0 10*3/uL (ref 0.0–0.1)
LYMPHS ABS: 2.4 10*3/uL (ref 0.7–3.1)
Lymphs: 23 %
MCH: 30 pg (ref 26.6–33.0)
MCHC: 33 g/dL (ref 31.5–35.7)
MCV: 91 fL (ref 79–97)
MONOS ABS: 0.9 10*3/uL (ref 0.1–0.9)
Monocytes: 9 %
Neutrophils Absolute: 6.6 10*3/uL (ref 1.4–7.0)
Neutrophils: 64 %
Platelets: 230 10*3/uL (ref 150–379)
RBC: 4.23 x10E6/uL (ref 3.77–5.28)
RDW: 15 % (ref 12.3–15.4)
WBC: 10.3 10*3/uL (ref 3.4–10.8)

## 2015-06-12 NOTE — Telephone Encounter (Signed)
Pt was seen in office yesterday with Dr.Bradshaw.

## 2015-06-19 ENCOUNTER — Telehealth: Payer: Self-pay | Admitting: Family Medicine

## 2015-06-20 NOTE — Telephone Encounter (Signed)
Patient given an appointment for Tuesday @ 9:55 with Wendi Snipes. I was on the phone with patient for over 45 minutes.

## 2015-06-24 ENCOUNTER — Encounter: Payer: Self-pay | Admitting: Family Medicine

## 2015-06-24 ENCOUNTER — Ambulatory Visit (INDEPENDENT_AMBULATORY_CARE_PROVIDER_SITE_OTHER): Payer: BLUE CROSS/BLUE SHIELD | Admitting: Family Medicine

## 2015-06-24 VITALS — BP 121/69 | HR 87 | Temp 99.1°F | Ht 66.0 in | Wt 191.4 lb

## 2015-06-24 DIAGNOSIS — R140 Abdominal distension (gaseous): Secondary | ICD-10-CM | POA: Diagnosis not present

## 2015-06-24 MED ORDER — LORAZEPAM 0.5 MG PO TABS: 0.5000 mg | ORAL_TABLET | Freq: Two times a day (BID) | ORAL | Status: DC | PRN

## 2015-06-24 NOTE — Patient Instructions (Signed)
Great to see you!  I think seeing a GI doctor is a good next step.  I would not change the lasix dose you are on.

## 2015-06-24 NOTE — Progress Notes (Signed)
   HPI  Patient presents today for "swelling all over".  Patient explains this is the one on for 6+ months. She's had a previous abdominal ultrasound and CAT scan of the abdomen which have not explain her symptoms. She was seen 2 weeks ago for dizziness which I thought was orthostatic hypotension, with cutting back on her Lasix her dizziness has resolved. She continues to take Lasix twice a week for swelling. She is also wearing her compression stockings now. She gets plenty of fluids  He complains of "all over swelling" and appearing "9 months pregnant" She's had recent labs which were normal, she denies any abdominal pain.  She was previously scheduled for colonoscopy, however she had to cancel this due to her ca being in the shop.  She's tolerating foods and fluids normally. She has no orthopnea Her palpitations are very well managed with BB  PMH: Smoking status noted ROS: Per HPI  Objective: BP 121/69 mmHg  Pulse 87  Temp(Src) 99.1 F (37.3 C) (Oral)  Ht 5\' 6"  (1.676 m)  Wt 191 lb 6.4 oz (86.818 kg)  BMI 30.91 kg/m2 Gen: NAD, alert, cooperative with exam HEENT: NCAT CV: RRR, good S1/S2, no murmur Resp: CTABL, no wheezes, non-labored Abd: SNTND, BS present, no guarding or organomegaly, abdominal distention Ext: 1+ pitting edema to the midcalf bilaterally Neuro: Alert and oriented, No gross deficits  Assessment and plan:  # Dizziness Resolved, continue only twice weekly lasix Discussed compression stocking for LE edema  # Abd distention With weight gain, her weight is stable form 1 month ago but up from when it looks like her symptoms started alast summer- about 8-9 months ago. See trend below CT and abd [place] normal, CMP WNL 2 weeks ago I provided reassurance and recommended consult with GI given her persistent symptoms, she needs a C scope as well Considered repeat US but I think ascites is unlikely with normal enzymes RTC in 3-4 weeks unless symptoms change or  progress.      Orders Placed This Encounter  Procedures  . Ambulatory referral to Gastroenterology    Referral Priority:  Routine    Referral Type:  Consultation    Referral Reason:  Specialty Services Required    Number of Visits Requested:  1    Ativan mistakenly entered in computer- never called in and it has been discontinued.    Laroy Apple, MD [facility] 06/24/2015, 12:14 PM

## 2015-06-25 ENCOUNTER — Telehealth: Payer: Self-pay | Admitting: Family Medicine

## 2015-06-25 NOTE — Telephone Encounter (Signed)
Spoke with patient.

## 2015-06-26 ENCOUNTER — Encounter: Payer: Self-pay | Admitting: Family Medicine

## 2015-06-26 ENCOUNTER — Ambulatory Visit (INDEPENDENT_AMBULATORY_CARE_PROVIDER_SITE_OTHER): Payer: BLUE CROSS/BLUE SHIELD | Admitting: Family Medicine

## 2015-06-26 VITALS — BP 123/75 | HR 74 | Temp 97.7°F | Ht 66.0 in | Wt 190.8 lb

## 2015-06-26 DIAGNOSIS — E039 Hypothyroidism, unspecified: Secondary | ICD-10-CM

## 2015-06-26 DIAGNOSIS — R635 Abnormal weight gain: Secondary | ICD-10-CM | POA: Diagnosis not present

## 2015-06-26 MED ORDER — ALPRAZOLAM 2 MG PO TABS: ORAL_TABLET | ORAL | Status: DC

## 2015-06-26 MED ORDER — ATORVASTATIN CALCIUM 20 MG PO TABS: 20.0000 mg | ORAL_TABLET | Freq: Every day | ORAL | Status: DC

## 2015-06-26 MED ORDER — ATENOLOL 50 MG PO TABS: ORAL_TABLET | ORAL | Status: DC

## 2015-06-26 NOTE — Progress Notes (Signed)
Patient ID: Heather Leblanc, female   DOB: 09-01-1950, 65 y.o.   MRN: [id number]  Primary Physician: Wardell Honour, MD  Chief Complaint: 65 year old female who is here to follow-up weight gain. As noted previously this has been evaluated one year ago. She lost weight and has now gained it back again I think this is related to poor diet consisting mostly of carbs and lack of movement and burning up calories. She has lots of anxieties and is generally controlled with alprazolam. Palpitations have improved on atenolol.     Past Medical History  Diagnosis Date  . Hypothyroidism   . Anxiety   . Eczema   . Cystitis   . Premature menopause   . History of phlebitis   . 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 History of seasonal allergies   . Depression   . Cancer ([place])     skin  . 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 Hyperlipidemia      Home Meds: Prior to Admission medications   Medication Sig Start Date End Date Taking? Authorizing Provider  alprazolam Duanne Moron) 2 MG tablet Take 2 mg in a.m., 1 mg in afternoon, and 2 mg in p.m. 04/23/15  Yes Wardell Honour, MD  atenolol (TENORMIN) 50 MG tablet Take 1 tablet daily in the morning then 1/2 tablet in the evenings 05/29/15  Yes Wardell Honour, MD  atorvastatin (LIPITOR) 20 MG tablet Take 20 mg by mouth. Monday & Thursday   Yes Historical Provider, MD  Bisacodyl (DULCOLAX PO) Take by mouth. Dulcolax as directed for colonoscopy prep   Yes Historical Provider, MD  Cholecalciferol (VITAMIN D) 2000 UNITS CAPS Take 2,000 Units by mouth daily.    Yes Historical Provider, MD  furosemide (LASIX) 40 MG tablet Take 1 tablet (40 mg total) by mouth daily. 02/25/15  Yes Wardell Honour, MD  levothyroxine (SYNTHROID, LEVOTHROID) 100 MCG tablet TAKE ONE TABLET BY MOUTH ONCE DAILY 09/12/14  Yes Wardell Honour, MD  nystatin cream (MYCOSTATIN) Apply 1 application topically 2 (two) times daily. Apply to affected area BID 12/03/14  Yes Wardell Honour, MD  Omega-3  Fatty Acids (FISH OIL PO) Take 1,000 mg by mouth 3 (three) times daily. 2 caos in am, 1 cap in pm   Yes Historical Provider, MD  Polyethylene Glycol 3350 (MIRALAX PO) Take by mouth as directed. Miralax 238 Grams as directed for colonoscopy prep   Yes Historical Provider, MD  triamcinolone cream (KENALOG) 0.1 % Apply 1 application topically 2 (two) times daily. 11/06/14  Yes Wardell Honour, MD    Allergies:  Allergies  Allergen Reactions  . Celexa [Citalopram Hydrobromide] Other (See Comments)    Panic atacks, palpitations, anxiety  . Iohexol Other (See Comments)    Chest pressure, shortness of breath, cardiac arrest   . Ivp Dye [Iodinated Diagnostic Agents] Other (See Comments)    Chest pressure, shortness of breath, cardiac arrest  . Phenobarbital Other (See Comments)    Chest pressure, shortness of breath, cardiac arrest  . Shellfish Allergy Shortness Of Breath and Swelling  . Ciprofloxacin Other (See Comments)    Doesn't work for UTI  . Tegretol [Carbamazepine] Other (See Comments)    Flu like symptoms  . Cinobac [Cinoxacin] Other (See Comments)    unknown  . Penicillins Other (See Comments)    unknown  . Sulfur Rash    Social History   Social History  . Marital Status: Divorced  Spouse Name: N/A  . Number of Children: 0  . Years of Education: N/A   Occupational History  . Not on file.   Social History Main Topics  . Smoking status: Never Smoker   . Smokeless tobacco: Never Used  . Alcohol Use: No  . Drug Use: No  . Sexual Activity: Not on file   Other Topics Concern  . Not on file   Social History Narrative   Lives alone.  She has a support group.       Review of Systems Constitutional: negative for chills, fever, night sweats, weight changes, or fatigue  HEENT: negative for vision changes, hearing loss, congestion, rhinorrhea, ST, epistaxis, or sinus pressure Cardiovascular: negative for chest pain or palpitations Respiratory: negative for hemoptysis,  wheezing, shortness of breath, or cough Abdominal: negative for abdominal pain, nausea, vomiting, diarrhea, or constipation Dermatological: negative for rash Neurologic: negative for headache, dizziness, or syncope All other systems reviewed and are otherwise negative with the exception to those above and in the HPI.   Physical Exam: Blood pressure 123/75, pulse 74, temperature 97.7 F (36.5 C), temperature source Oral, height 5\' 6"  (1.676 m), weight 190 lb 12.8 oz (86.546 kg)., Body mass index is 30.81 kg/(m^2). General: Well developed, well nourished, in no acute distress. Head: Normocephalic, atraumatic, eyes without discharge, sclera non-icteric, nares are without discharge. Bilateral auditory canals clear, TM's are without perforation, pearly grey and translucent with reflective cone of light bilaterally. Oral cavity moist, posterior pharynx without exudate, erythema, peritonsillar abscess, or post nasal drip.  Neck: Supple. No thyromegaly. Full ROM. No lymphadenopathy. Lungs: Clear bilaterally to auscultation without wheezes, rales, or rhonchi. Breathing is unlabored. Heart: RRR with S1 S2. No murmurs, rubs, or gallops appreciated. Abdomen: Soft, non-tender, non-distended with normoactive bowel sounds. No hepatomegaly. No rebound/guarding. No obvious abdominal masses. Msk:  Strength and tone normal for age.  Extremities/Skin: Warm and dry. No clubbing or cyanosis. No edema.  rash on back of legs ? bites Neuro: Alert and oriented X 3. Moves all extremities spontaneously. Gait is normal. CNII-XII grossly in tact. Psych:  Responds to questions appropriately with a normal affect.   Labs:   ASSESSMENT AND PLAN:   1. Weight gain Will check thyroid. She is on hormone replacement possibly needs more thyroid but I think her weight gain is 2 to calories and lack of exercise - Thyroid Panel With TSH  Wardell Honour MD 06/26/2015 3:02 PM

## 2015-06-26 NOTE — Patient Instructions (Signed)
Thank you for allowing us to care for you today. We strive to provide exceptional quality and compassionate care. Please let us know how we are doing and how we can help serve you better by filling out the survey that you receive from [employer].     

## 2015-06-27 LAB — THYROID PANEL WITH TSH
Free Thyroxine Index: 1.6 (ref 1.2–4.9)
T3 Uptake Ratio: 25 % (ref 24–39)
T4, Total: 6.2 ug/dL (ref 4.5–12.0)
TSH: 10.6 u[IU]/mL — ABNORMAL HIGH (ref 0.450–4.500)

## 2015-06-30 MED ORDER — LEVOTHYROXINE SODIUM 112 MCG PO TABS: 112.0000 ug | ORAL_TABLET | Freq: Every day | ORAL | Status: DC

## 2015-06-30 NOTE — Addendum Note (Signed)
Addended by: Thana Ates on: 06/30/2015 10:38 AM   Modules accepted: Orders

## 2015-07-14 ENCOUNTER — Encounter: Payer: Self-pay | Admitting: Family Medicine

## 2015-07-24 ENCOUNTER — Ambulatory Visit (INDEPENDENT_AMBULATORY_CARE_PROVIDER_SITE_OTHER): Payer: Medicare Other | Admitting: Family Medicine

## 2015-07-24 ENCOUNTER — Encounter: Payer: Self-pay | Admitting: Family Medicine

## 2015-07-24 VITALS — BP 120/71 | HR 75 | Temp 97.0°F | Ht 66.0 in | Wt 193.4 lb

## 2015-07-24 DIAGNOSIS — N898 Other specified noninflammatory disorders of vagina: Secondary | ICD-10-CM

## 2015-07-24 DIAGNOSIS — N9489 Other specified conditions associated with female genital organs and menstrual cycle: Secondary | ICD-10-CM

## 2015-07-24 DIAGNOSIS — R300 Dysuria: Secondary | ICD-10-CM

## 2015-07-24 DIAGNOSIS — R609 Edema, unspecified: Secondary | ICD-10-CM

## 2015-07-24 LAB — URINALYSIS, COMPLETE
Bilirubin, UA: NEGATIVE
Glucose, UA: NEGATIVE
Ketones, UA: NEGATIVE
LEUKOCYTES UA: NEGATIVE
Nitrite, UA: NEGATIVE
PH UA: 7 (ref 5.0–7.5)
PROTEIN UA: NEGATIVE
RBC UA: NEGATIVE
Specific Gravity, UA: 1.01 (ref 1.005–1.030)
Urobilinogen, Ur: 0.2 mg/dL (ref 0.2–1.0)

## 2015-07-24 LAB — WET PREP FOR TRICH, YEAST, CLUE
Clue Cell Exam: POSITIVE — AB
TRICHOMONAS EXAM: NEGATIVE
YEAST EXAM: NEGATIVE

## 2015-07-24 LAB — MICROSCOPIC EXAMINATION
BACTERIA UA: NONE SEEN
RBC, UA: NONE SEEN /hpf (ref 0–?)

## 2015-07-24 MED ORDER — METRONIDAZOLE 500 MG PO TABS: 500.0000 mg | ORAL_TABLET | Freq: Two times a day (BID) | ORAL | Status: DC

## 2015-07-24 NOTE — Progress Notes (Signed)
   Subjective:    Patient ID: Heather Leblanc, female    DOB: 12-Dec-1950, 65 y.o.   MRN: [id number]  HPI 65 year old female here to follow-up peripheral edema, anxiety disorder, weight gain, and today some dysuria with vaginal odor. Her weight gain continues as does her peripheral edema. Review of her labs shows some slight hypoproteinemia likely related to poor diet. She has seen cardiology. There is no evidence of CHF. Her renal function including GFR BUN/creatinine is normal. She is also seen vascular surgeons who tell her she has no problems with her veins, although I question this.  Patient Active Problem List   Diagnosis Date Noted  . Palpitations 04/17/2015  . Palpitation 07/10/2014  . Urinary tract infectious disease 01/31/2014  . Hydradenitis 01/31/2014  . Peripheral edema 12/03/2013  . White coat syndrome without hypertension 12/03/2013  . Hypothyroidism 12/08/2012  . GAD (generalized anxiety disorder) 12/08/2012   Outpatient Encounter Prescriptions as of 07/24/2015  Medication Sig  . alprazolam (XANAX) 2 MG tablet Take 2 mg in a.m., 1 mg in afternoon, and 2 mg in p.m.  Marland Kitchen atenolol (TENORMIN) 50 MG tablet Take 1 tablet daily in the morning then 1/2 tablet in the evenings  . atorvastatin (LIPITOR) 20 MG tablet Take 1 tablet (20 mg total) by mouth daily at 6 PM. Monday & Thursday  . Bisacodyl (DULCOLAX PO) Take by mouth. Dulcolax as directed for colonoscopy prep  . Cholecalciferol (VITAMIN D) 2000 UNITS CAPS Take 2,000 Units by mouth daily.   . furosemide (LASIX) 40 MG tablet Take 1 tablet (40 mg total) by mouth daily.  Marland Kitchen levothyroxine (SYNTHROID, LEVOTHROID) 112 MCG tablet Take 1 tablet (112 mcg total) by mouth daily.  Marland Kitchen nystatin cream (MYCOSTATIN) Apply 1 application topically 2 (two) times daily. Apply to affected area BID  . Omega-3 Fatty Acids (FISH OIL PO) Take 1,000 mg by mouth 3 (three) times daily. 2 caos in am, 1 cap in pm  . Polyethylene Glycol 3350 (MIRALAX PO) Take by  mouth as directed. Miralax 238 Grams as directed for colonoscopy prep  . triamcinolone cream (KENALOG) 0.1 % Apply 1 application topically 2 (two) times daily.   No facility-administered encounter medications on file as of 07/24/2015.      Review of Systems  Constitutional: Positive for unexpected weight change.  Respiratory: Negative.   Cardiovascular: Positive for leg swelling.  Neurological: Negative.   Psychiatric/Behavioral: The patient is nervous/anxious.        Objective:   Physical Exam  Constitutional: She appears well-developed and well-nourished.  Cardiovascular: Normal rate and regular rhythm.   Pulmonary/Chest: Effort normal and breath sounds normal.  Musculoskeletal: She exhibits edema.  Psychiatric: She has a normal mood and affect. Her behavior is normal.          Assessment & Plan:  1. Dysuria Prep is positive for clue cells suggesting bacterial vaginosis. Will treat with metronidazole 500 mg twice a day for 1 week - WET PREP FOR TRICH, YEAST, CLUE - Urinalysis, Complete  2. Vaginal odor See above  for discussion - WET PREP FOR TRICH, YEAST, CLUE - Urinalysis, Complete   3. Peripheral edema Needs to take Lasix probably every day. Continue to work on diet weight loss and   Wardell Honour MD

## 2015-07-31 ENCOUNTER — Encounter: Payer: Self-pay | Admitting: Family Medicine

## 2015-07-31 ENCOUNTER — Ambulatory Visit (INDEPENDENT_AMBULATORY_CARE_PROVIDER_SITE_OTHER): Payer: Medicare Other | Admitting: Family Medicine

## 2015-07-31 VITALS — BP 105/67 | HR 82 | Temp 98.8°F | Ht 66.0 in | Wt 193.0 lb

## 2015-07-31 DIAGNOSIS — R609 Edema, unspecified: Secondary | ICD-10-CM

## 2015-07-31 NOTE — Assessment & Plan Note (Signed)
Have already ruled out liver and kidneys as a possible causes swelling, patient insists on ruling out cardiac causes. Recommended leg compressions and Lasix when necessary.

## 2015-07-31 NOTE — Progress Notes (Signed)
BP 105/67 mmHg  Pulse 82  Temp(Src) 98.8 F (37.1 C) (Oral)  Ht 5\' 6"  (1.676 m)  Wt 193 lb (87.544 kg)  BMI 31.17 kg/m2   Subjective:    Patient ID: Heather Leblanc, female    DOB: July 11, 1950, 65 y.o.   MRN: [id number]  HPI: Heather Leblanc is a 65 y.o. female presenting on 07/31/2015 for Edema and Bump inside right ear   HPI Swelling in legs Patient comes in because she has had persistent and increasing swelling in both lower legs. She denies any fevers or chills or redness or warmth to the legs. She just has this pitting edema. She has been taking Lasix when necessary for this but does not feel like it is sufficiently helping. She did have labs on 06/11/2015 which showed that her kidney and her liver were both functioning normally. It did show that her thyroid was slightly off and they did adjust those medications. The swelling has been going on for at least a few months. She is most concerned about cardiac problems and whether she might have something going on with her heart that would cause this.  Relevant past medical, surgical, family and social history reviewed and updated as indicated. Interim medical history since our last visit reviewed. Allergies and medications reviewed and updated.  Review of Systems  Constitutional: Negative for fever and chills.  HENT: Negative for congestion, ear discharge and ear pain.   Eyes: Negative for redness and visual disturbance.  Respiratory: Negative for cough, chest tightness and shortness of breath.   Cardiovascular: Positive for leg swelling. Negative for chest pain.  Genitourinary: Negative for dysuria and difficulty urinating.  Musculoskeletal: Negative for back pain and gait problem.  Skin: Negative for color change, rash and wound.  Neurological: Negative for light-headedness and headaches.  Psychiatric/Behavioral: Negative for behavioral problems and agitation.  All other systems reviewed and are negative.   Per HPI unless  specifically indicated above     Medication List       This list is accurate as of: 07/31/15  8:43 AM.  Always use your most recent med list.               alprazolam 2 MG tablet  Commonly known as:  XANAX  Take 2 mg in a.m., 1 mg in afternoon, and 2 mg in p.m.     atenolol 50 MG tablet  Commonly known as:  TENORMIN  Take 1 tablet daily in the morning then 1/2 tablet in the evenings     atorvastatin 20 MG tablet  Commonly known as:  LIPITOR  Take 1 tablet (20 mg total) by mouth daily at 6 PM. Monday & Thursday     DULCOLAX PO  Take by mouth. Dulcolax as directed for colonoscopy prep     FISH OIL PO  Take 1,000 mg by mouth 3 (three) times daily. 2 caos in am, 1 cap in pm     furosemide 40 MG tablet  Commonly known as:  LASIX  Take 1 tablet (40 mg total) by mouth daily.     levothyroxine 112 MCG tablet  Commonly known as:  SYNTHROID, LEVOTHROID  Take 1 tablet (112 mcg total) by mouth daily.     metroNIDAZOLE 500 MG tablet  Commonly known as:  FLAGYL  Take 1 tablet (500 mg total) by mouth 2 (two) times daily.     MIRALAX PO  Take by mouth as directed. Miralax 238 Grams as directed for colonoscopy prep  nystatin cream  Commonly known as:  MYCOSTATIN  Apply 1 application topically 2 (two) times daily. Apply to affected area BID     triamcinolone cream 0.1 %  Commonly known as:  KENALOG  Apply 1 application topically 2 (two) times daily.     Vitamin D 2000 units Caps  Take 2,000 Units by mouth daily.           Objective:    BP 105/67 mmHg  Pulse 82  Temp(Src) 98.8 F (37.1 C) (Oral)  Ht 5\' 6"  (1.676 m)  Wt 193 lb (87.544 kg)  BMI 31.17 kg/m2  Wt Readings from Last 3 Encounters:  07/31/15 193 lb (87.544 kg)  07/24/15 193 lb 6.4 oz (87.726 kg)  06/26/15 190 lb 12.8 oz (86.546 kg)    Physical Exam  Constitutional: She is oriented to person, place, and time. She appears well-developed and well-nourished. No distress.  Eyes: Conjunctivae and EOM are  normal. Pupils are equal, round, and reactive to light.  Neck: Neck supple. No thyromegaly present.  Cardiovascular: Normal rate, regular rhythm, normal heart sounds and intact distal pulses.   No murmur heard. Pulmonary/Chest: Effort normal and breath sounds normal. No respiratory distress. She has no wheezes. She has no rales.  Musculoskeletal: Normal range of motion. She exhibits edema (2+ pitting edema bilateral lower extremities). She exhibits no tenderness.  Lymphadenopathy:    She has no cervical adenopathy.  Neurological: She is alert and oriented to person, place, and time. Coordination normal.  Skin: Skin is warm and dry. No rash noted. She is not diaphoretic.  Psychiatric: She has a normal mood and affect. Her behavior is normal.  Nursing note and vitals reviewed.     Assessment & Plan:   Problem List Items Addressed This Visit      Other   Peripheral edema - Primary    Have already ruled out liver and kidneys as a possible causes swelling, patient insists on ruling out cardiac causes. Recommended leg compressions and Lasix when necessary.      Relevant Orders   Echocardiogram      Follow up plan: Return if symptoms worsen or fail to improve.  Counseling provided for all of the vaccine components Orders Placed This Encounter  Procedures  . Echocardiogram    Caryl Pina, MD [facility] 07/31/2015, 8:43 AM

## 2015-08-01 ENCOUNTER — Telehealth: Payer: Self-pay | Admitting: Family Medicine

## 2015-08-01 ENCOUNTER — Other Ambulatory Visit: Payer: Self-pay | Admitting: Family Medicine

## 2015-08-01 ENCOUNTER — Ambulatory Visit (HOSPITAL_COMMUNITY)
Admission: RE | Admit: 2015-08-01 | Discharge: 2015-08-01 | Disposition: A | Discharge status: Home / Self Care | Payer: Medicare Other | Source: Ambulatory Visit | Attending: Family Medicine | Admitting: Family Medicine

## 2015-08-01 DIAGNOSIS — I517 Cardiomegaly: Secondary | ICD-10-CM | POA: Insufficient documentation

## 2015-08-01 DIAGNOSIS — R609 Edema, unspecified: Secondary | ICD-10-CM | POA: Insufficient documentation

## 2015-08-01 NOTE — Telephone Encounter (Signed)
Pt aware of echocardiogram results.

## 2015-08-01 NOTE — Progress Notes (Signed)
*  PRELIMINARY RESULTS* Echocardiogram 2D Echocardiogram has been performed.  Leavy Cella 08/01/2015, 12:46 PM

## 2015-08-04 ENCOUNTER — Telehealth: Payer: Self-pay | Admitting: Cardiology

## 2015-08-04 NOTE — Telephone Encounter (Signed)
Patient called requesting an appointment to discuss her echo results with Dr. Domenic Polite. Echo results were reviewed with patient while on the phone. Patient advised that she didn't need an appointment to discuss echo results and informed that if she needed an appointment with Dr. Domenic Polite that she would need a new referral from her PCP. Patient verbalized understanding of plan.

## 2015-08-04 NOTE — Telephone Encounter (Signed)
Would like to have someone call her to explain the Echo in better detail that what the report says.

## 2015-08-05 ENCOUNTER — Other Ambulatory Visit (HOSPITAL_COMMUNITY): Payer: Self-pay

## 2015-08-22 ENCOUNTER — Ambulatory Visit (INDEPENDENT_AMBULATORY_CARE_PROVIDER_SITE_OTHER): Payer: Medicare Other | Admitting: Family Medicine

## 2015-08-22 ENCOUNTER — Encounter: Payer: Self-pay | Admitting: Family Medicine

## 2015-08-22 VITALS — BP 113/78 | HR 70 | Temp 96.8°F | Ht 66.0 in | Wt 188.0 lb

## 2015-08-22 DIAGNOSIS — N390 Urinary tract infection, site not specified: Secondary | ICD-10-CM | POA: Diagnosis not present

## 2015-08-22 DIAGNOSIS — R609 Edema, unspecified: Secondary | ICD-10-CM | POA: Diagnosis not present

## 2015-08-22 DIAGNOSIS — R319 Hematuria, unspecified: Secondary | ICD-10-CM | POA: Diagnosis not present

## 2015-08-22 DIAGNOSIS — E039 Hypothyroidism, unspecified: Secondary | ICD-10-CM

## 2015-08-22 DIAGNOSIS — R002 Palpitations: Secondary | ICD-10-CM | POA: Diagnosis not present

## 2015-08-22 MED ORDER — METRONIDAZOLE 500 MG PO TABS: 500.0000 mg | ORAL_TABLET | Freq: Two times a day (BID) | ORAL | Status: DC

## 2015-08-22 NOTE — Addendum Note (Signed)
Addended by: Zannie Cove on: 08/22/2015 03:55 PM   Modules accepted: Orders

## 2015-08-22 NOTE — Progress Notes (Signed)
Subjective:    Patient ID: Heather Leblanc, female    DOB: 1950/05/27, 65 y.o.   MRN: [id number]  HPI Patient here today for 1 month follow up on edema and hypothyroid. She is taking medications regularly. Routine follow-up for hypothyroidism and dependent edema that is now thought to be related to lymphedema. She was seen at vascular Center and treatment with pneumatic compression device was recommended. She had failed leg elevation and graduated compression stockings as well as venous treatment. She has been using this pneumatic device now for several days and edema has improved and generally she feels better area     Patient Active Problem List   Diagnosis Date Noted  . Palpitations 04/17/2015  . Palpitation 07/10/2014  . Urinary tract infectious disease 01/31/2014  . Hydradenitis 01/31/2014  . Peripheral edema 12/03/2013  . White coat syndrome without hypertension 12/03/2013  . Hypothyroidism 12/08/2012  . GAD (generalized anxiety disorder) 12/08/2012   Outpatient Encounter Prescriptions as of 08/22/2015  Medication Sig  . alprazolam (XANAX) 2 MG tablet Take 2 mg in a.m., 1 mg in afternoon, and 2 mg in p.m.  Marland Kitchen atenolol (TENORMIN) 50 MG tablet Take 1 tablet daily in the morning then 1/2 tablet in the evenings  . atorvastatin (LIPITOR) 20 MG tablet Take 1 tablet (20 mg total) by mouth daily at 6 PM. Monday & Thursday  . Bisacodyl (DULCOLAX PO) Take by mouth. Dulcolax as directed for colonoscopy prep  . Cholecalciferol (VITAMIN D) 2000 UNITS CAPS Take 2,000 Units by mouth daily.   . furosemide (LASIX) 40 MG tablet Take 1 tablet (40 mg total) by mouth daily.  Marland Kitchen levothyroxine (SYNTHROID, LEVOTHROID) 112 MCG tablet Take 1 tablet (112 mcg total) by mouth daily.  . metroNIDAZOLE (FLAGYL) 500 MG tablet Take 1 tablet (500 mg total) by mouth 2 (two) times daily.  Marland Kitchen nystatin cream (MYCOSTATIN) Apply 1 application topically 2 (two) times daily. Apply to affected area BID  . Omega-3 Fatty Acids  (FISH OIL PO) Take 1,000 mg by mouth 3 (three) times daily. 2 caos in am, 1 cap in pm  . Polyethylene Glycol 3350 (MIRALAX PO) Take by mouth as directed. Miralax 238 Grams as directed for colonoscopy prep  . triamcinolone cream (KENALOG) 0.1 % Apply 1 application topically 2 (two) times daily.   No facility-administered encounter medications on file as of 08/22/2015.      Review of Systems  Constitutional: Negative.   HENT: Negative.   Eyes: Negative.   Respiratory: Negative.   Cardiovascular: Positive for leg swelling.       Recently seen [facility]  Gastrointestinal: Negative.   Endocrine: Negative.   Genitourinary: Negative.   Musculoskeletal: Negative.   Skin: Negative.   Allergic/Immunologic: Negative.   Neurological: Negative.   Hematological: Negative.   Psychiatric/Behavioral: Negative.        Objective:   Physical Exam  Constitutional: She appears well-developed and well-nourished.  Cardiovascular: Normal rate, regular rhythm and normal heart sounds.   Pulmonary/Chest: Effort normal and breath sounds normal.   BP 113/78 mmHg  Pulse 70  Temp(Src) 96.8 F (36 C) (Oral)  Ht 5\' 6"  (1.676 m)  Wt 188 lb (85.276 kg)  BMI 30.36 kg/m2        Assessment & Plan:  1. Hypothyroidism, unspecified hypothyroidism type TSH was still greater than 10.5 continue to gradually increase dose until we get into therapeutic range - TSH  2. Palpitation No complaints of palpitations today. Continue with atenolol as before  3. Peripheral edema Edema has decreased. Still using furosemide on some days.  Wardell Honour MD

## 2015-08-23 LAB — TSH: TSH: 1.65 u[IU]/mL (ref 0.450–4.500)

## 2015-08-26 ENCOUNTER — Encounter: Payer: Self-pay | Admitting: Gastroenterology

## 2015-08-28 ENCOUNTER — Other Ambulatory Visit: Payer: Self-pay | Admitting: Family Medicine

## 2015-09-03 ENCOUNTER — Ambulatory Visit (INDEPENDENT_AMBULATORY_CARE_PROVIDER_SITE_OTHER): Payer: Medicare Other | Admitting: Family Medicine

## 2015-09-03 ENCOUNTER — Encounter: Payer: Self-pay | Admitting: Family Medicine

## 2015-09-03 ENCOUNTER — Ambulatory Visit (HOSPITAL_COMMUNITY)
Admission: RE | Admit: 2015-09-03 | Discharge: 2015-09-03 | Disposition: A | Discharge status: Home / Self Care | Payer: Medicare Other | Source: Ambulatory Visit | Attending: Family Medicine | Admitting: Family Medicine

## 2015-09-03 VITALS — BP 114/72 | HR 81 | Temp 98.7°F | Ht 66.0 in | Wt 190.2 lb

## 2015-09-03 DIAGNOSIS — R609 Edema, unspecified: Secondary | ICD-10-CM | POA: Diagnosis not present

## 2015-09-03 DIAGNOSIS — R140 Abdominal distension (gaseous): Secondary | ICD-10-CM | POA: Insufficient documentation

## 2015-09-03 DIAGNOSIS — K760 Fatty (change of) liver, not elsewhere classified: Secondary | ICD-10-CM | POA: Insufficient documentation

## 2015-09-03 NOTE — Progress Notes (Signed)
BP 114/72 mmHg  Pulse 81  Temp(Src) 98.7 F (37.1 C) (Oral)  Ht 5\' 6"  (1.676 m)  Wt 190 lb 3.2 oz (86.274 kg)  BMI 30.71 kg/m2   Subjective:    Patient ID: Heather Leblanc, female    DOB: 1951/03/12, 64 y.o.   MRN: [id number]  HPI: Heather Leblanc is a 65 y.o. female presenting on 09/03/2015 for Edema   HPI Edema Patient is coming for swelling and edema she feels like her abdomen is distended and swollen plus she has pitting and swelling in her legs. She is not wearing compression stockings or Ace wraps here today. She has been taking Lasix daily as needed sometimes. She says the Lasix helps with the but she does not like compression stockings because of the pain from the compression. She denies any fevers or chills. She denies any shortness of breath or cough or wheezing. She has had an echocardiogram recently and both liver and kidney testing [place] back essentially normal.  Relevant past medical, surgical, family and social history reviewed and updated as indicated. Interim medical history since our last visit reviewed. Allergies and medications reviewed and updated.  Review of Systems  Constitutional: Negative for fever and chills.  HENT: Negative for congestion, ear discharge and ear pain.   Eyes: Negative for redness and visual disturbance.  Respiratory: Negative for cough, chest tightness and shortness of breath.   Cardiovascular: Positive for leg swelling. Negative for chest pain.  Gastrointestinal: Positive for abdominal distention. Negative for nausea, vomiting, abdominal pain, diarrhea and constipation.  Genitourinary: Negative for dysuria and difficulty urinating.  Musculoskeletal: Negative for back pain and gait problem.  Skin: Negative for color change, rash and wound.  Neurological: Negative for light-headedness and headaches.  Psychiatric/Behavioral: Negative for behavioral problems and agitation.  All other systems reviewed and are negative.   Per HPI unless  specifically indicated above     Medication List       This list is accurate as of: 09/03/15  8:43 AM.  Always use your most recent med list.               alprazolam 2 MG tablet  Commonly known as:  XANAX  Take 2 mg in a.m., 1 mg in afternoon, and 2 mg in p.m.     atenolol 50 MG tablet  Commonly known as:  TENORMIN  TAKE 1 TABLET IN THE MORNING AND 1/2 TABLET IN THE EVENING     atorvastatin 20 MG tablet  Commonly known as:  LIPITOR  Take 1 tablet (20 mg total) by mouth daily at 6 PM. Monday & Thursday     DULCOLAX PO  Take by mouth. Dulcolax as directed for colonoscopy prep     FISH OIL PO  Take 1,000 mg by mouth 3 (three) times daily. 2 caos in am, 1 cap in pm     furosemide 40 MG tablet  Commonly known as:  LASIX  TAKE 1 TABLET DAILY     levothyroxine 112 MCG tablet  Commonly known as:  SYNTHROID, LEVOTHROID  TAKE 1 TABLET DAILY     metroNIDAZOLE 500 MG tablet  Commonly known as:  FLAGYL  Take 1 tablet (500 mg total) by mouth 2 (two) times daily.     MIRALAX PO  Take by mouth as directed. Miralax 238 Grams as directed for colonoscopy prep     nystatin cream  Commonly known as:  MYCOSTATIN  Apply 1 application topically 2 (two) times daily. Apply  to affected area BID     triamcinolone cream 0.1 %  Commonly known as:  KENALOG  Apply 1 application topically 2 (two) times daily.     Vitamin D 2000 units Caps  Take 2,000 Units by mouth daily.           Objective:    BP 114/72 mmHg  Pulse 81  Temp(Src) 98.7 F (37.1 C) (Oral)  Ht 5\' 6"  (1.676 m)  Wt 190 lb 3.2 oz (86.274 kg)  BMI 30.71 kg/m2  Wt Readings from Last 3 Encounters:  09/03/15 190 lb 3.2 oz (86.274 kg)  08/22/15 188 lb (85.276 kg)  07/31/15 193 lb (87.544 kg)    Physical Exam  Constitutional: She is oriented to person, place, and time. She appears well-developed and well-nourished. No distress.  Eyes: Conjunctivae and EOM are normal. Pupils are equal, round, and reactive to light.    Neck: Neck supple. No thyromegaly present.  Cardiovascular: Normal rate, regular rhythm, normal heart sounds and intact distal pulses.   No murmur heard. Pulmonary/Chest: Effort normal and breath sounds normal. No respiratory distress. She has no wheezes. She has no rales.  Abdominal: Soft. Bowel sounds are normal. She exhibits distension (Tight abdomen, difficult to tell if it is fluid). There is no tenderness. There is no rebound.  Musculoskeletal: Normal range of motion. She exhibits no edema or tenderness.  Lymphadenopathy:    She has no cervical adenopathy.  Neurological: She is alert and oriented to person, place, and time. Coordination normal.  Skin: Skin is warm and dry. No rash noted. She is not diaphoretic.  Psychiatric: She has a normal mood and affect. Her behavior is normal.  Nursing note and vitals reviewed.     Assessment & Plan:   Problem List Items Addressed This Visit      Other   Peripheral edema - Primary    Other Visit Diagnoses    Abdominal distention, non-gaseous        Relevant Orders    US Abdomen Complete        Follow up plan: Return if symptoms worsen or fail to improve.  Counseling provided for all of the vaccine components Orders Placed This Encounter  Procedures  . US Abdomen Complete    Caryl Pina, MD [facility] 09/03/2015, 8:43 AM

## 2015-09-24 ENCOUNTER — Ambulatory Visit: Payer: Medicare Other | Admitting: Family Medicine

## 2015-09-24 ENCOUNTER — Encounter: Payer: Self-pay | Admitting: Family Medicine

## 2015-09-24 ENCOUNTER — Ambulatory Visit (INDEPENDENT_AMBULATORY_CARE_PROVIDER_SITE_OTHER): Payer: Medicare Other | Admitting: Family Medicine

## 2015-09-24 VITALS — BP 111/69 | HR 75 | Temp 98.1°F | Ht 66.0 in | Wt 193.2 lb

## 2015-09-24 DIAGNOSIS — R002 Palpitations: Secondary | ICD-10-CM

## 2015-09-24 DIAGNOSIS — E039 Hypothyroidism, unspecified: Secondary | ICD-10-CM

## 2015-09-24 DIAGNOSIS — R609 Edema, unspecified: Secondary | ICD-10-CM

## 2015-09-24 NOTE — Progress Notes (Signed)
Subjective:    Patient ID: Heather Leblanc, female    DOB: [birth year], 65 y.o.   MRN: [id number]  HPI 65 year old female here to follow-up anxiety palpitations hypothyroid and hyperlipidemia. No new symptoms or problems. She did discontinue to gain weight. She is using her pump for lymphedema but still has edema in both lower extremities. She does feel a little weak today so I think it's reasonable to check potassium given the fact she takes Lasix on a regular basis. Spent a fair amount of time talking about her weight gain and my desire to see her stop gaining and start exercising. She talks about exercising in the [facility] pool but that just never happens.  Patient Active Problem List   Diagnosis Date Noted  . Palpitations 04/17/2015  . Palpitation 07/10/2014  . Urinary tract infectious disease 01/31/2014  . Hydradenitis 01/31/2014  . Peripheral edema 12/03/2013  . White coat syndrome without hypertension 12/03/2013  . Hypothyroidism 12/08/2012  . GAD (generalized anxiety disorder) 12/08/2012   Outpatient Encounter Prescriptions as of 09/24/2015  Medication Sig  . alprazolam (XANAX) 2 MG tablet Take 2 mg in a.m., 1 mg in afternoon, and 2 mg in p.m.  Marland Kitchen atenolol (TENORMIN) 50 MG tablet TAKE 1 TABLET IN THE MORNING AND 1/2 TABLET IN THE EVENING  . atorvastatin (LIPITOR) 20 MG tablet Take 1 tablet (20 mg total) by mouth daily at 6 PM. Monday & Thursday  . furosemide (LASIX) 40 MG tablet TAKE 1 TABLET DAILY  . levothyroxine (SYNTHROID, LEVOTHROID) 112 MCG tablet TAKE 1 TABLET DAILY  . metroNIDAZOLE (FLAGYL) 500 MG tablet Take 1 tablet (500 mg total) by mouth 2 (two) times daily.  . Omega-3 Fatty Acids (FISH OIL PO) Take 1,000 mg by mouth 3 (three) times daily. 2 caos in am, 1 cap in pm  . Bisacodyl (DULCOLAX PO) Take by mouth. Reported on 09/24/2015  . Cholecalciferol (VITAMIN D) 2000 UNITS CAPS Take 2,000 Units by mouth daily. Reported on 09/24/2015  . nystatin cream (MYCOSTATIN) Apply 1  application topically 2 (two) times daily. Apply to affected area BID (Patient not taking: Reported on 09/24/2015)  . Polyethylene Glycol 3350 (MIRALAX PO) Take by mouth as directed. Reported on 09/24/2015  . triamcinolone cream (KENALOG) 0.1 % Apply 1 application topically 2 (two) times daily. (Patient not taking: Reported on 09/24/2015)   No facility-administered encounter medications on file as of 09/24/2015.      Review of Systems  Constitutional: Positive for unexpected weight change.  Respiratory: Negative.   Cardiovascular: Negative.   Gastrointestinal: Negative.   Genitourinary: Negative.   Psychiatric/Behavioral: The patient is nervous/anxious.        Objective:   Physical Exam  Constitutional: She is oriented to person, place, and time. She appears well-developed and well-nourished.  Cardiovascular: Normal rate and regular rhythm.   Pulmonary/Chest: Effort normal.  Abdominal: Soft. There is no tenderness. There is no guarding.  Neurological: She is alert and oriented to person, place, and time.  Psychiatric: She has a normal mood and affect. Her behavior is normal.   BP 111/69 mmHg  Pulse 75  Temp(Src) 98.1 F (36.7 C) (Oral)  Ht '5\' 6"'$  (1.676 m)  Wt 193 lb 3.2 oz (87.635 kg)  BMI 31.20 kg/m2        Assessment & Plan:  1. Edema, unspecified type We'll check serum electrolytes especially potassium and protein levels to see if there is any need for intervention as it relates to her edema and weight gain -  CMP14+EGFR   2. Hypothyroidism, unspecified hypothyroidism type SH is now therapeutic.  3. Palpitations Dictations are partially controlled with beta blocker continue to take twice a day (atenolol)  Wardell Honour MD

## 2015-09-25 DIAGNOSIS — Z1231 Encounter for screening mammogram for malignant neoplasm of breast: Secondary | ICD-10-CM | POA: Diagnosis not present

## 2015-09-25 DIAGNOSIS — Z01419 Encounter for gynecological examination (general) (routine) without abnormal findings: Secondary | ICD-10-CM | POA: Diagnosis not present

## 2015-09-25 LAB — CMP14+EGFR
A/G RATIO: 1.3 (ref 1.2–2.2)
ALT: 40 IU/L — AB (ref 0–32)
AST: 32 IU/L (ref 0–40)
Albumin: 4.3 g/dL (ref 3.6–4.8)
Alkaline Phosphatase: 99 IU/L (ref 39–117)
BILIRUBIN TOTAL: 0.5 mg/dL (ref 0.0–1.2)
BUN/Creatinine Ratio: 18 (ref 12–28)
BUN: 13 mg/dL (ref 8–27)
CALCIUM: 9.3 mg/dL (ref 8.7–10.3)
CHLORIDE: 92 mmol/L — AB (ref 96–106)
CO2: 30 mmol/L — ABNORMAL HIGH (ref 18–29)
Creatinine, Ser: 0.71 mg/dL (ref 0.57–1.00)
GFR calc Af Amer: 103 mL/min/{1.73_m2} (ref >59)
GFR calc non Af Amer: 90 mL/min/{1.73_m2} (ref >59)
GLOBULIN, TOTAL: 3.2 g/dL (ref 1.5–4.5)
Glucose: 104 mg/dL — ABNORMAL HIGH (ref 65–99)
POTASSIUM: 4.5 mmol/L (ref 3.5–5.2)
SODIUM: 138 mmol/L (ref 134–144)
TOTAL PROTEIN: 7.5 g/dL (ref 6.0–8.5)

## 2015-10-03 ENCOUNTER — Ambulatory Visit (INDEPENDENT_AMBULATORY_CARE_PROVIDER_SITE_OTHER): Payer: Medicare Other | Admitting: Family Medicine

## 2015-10-03 ENCOUNTER — Encounter: Payer: Self-pay | Admitting: Family Medicine

## 2015-10-03 VITALS — BP 140/75 | HR 74 | Temp 97.5°F | Ht 66.0 in | Wt 196.0 lb

## 2015-10-03 DIAGNOSIS — M25511 Pain in right shoulder: Secondary | ICD-10-CM | POA: Diagnosis not present

## 2015-10-03 NOTE — Progress Notes (Signed)
   Subjective:    Patient ID: Heather Leblanc, female    DOB: 1950-05-09, 65 y.o.   MRN: [id number]  HPI Patient here today with complaints of right shoulder pain  Shoulder was injected previously symptoms have recurred. This is a good day for [facility]. Her edema is improved she has good energy levels and her spirits are good.  Shoulder pain is anterior and superior in location there is been no recent change in activities or injuries       Patient Active Problem List   Diagnosis Date Noted  . Palpitations 04/17/2015  . Palpitation 07/10/2014  . Urinary tract infectious disease 01/31/2014  . Hydradenitis 01/31/2014  . Peripheral edema 12/03/2013  . White coat syndrome without hypertension 12/03/2013  . Hypothyroidism 12/08/2012  . GAD (generalized anxiety disorder) 12/08/2012   Outpatient Encounter Prescriptions as of 10/03/2015  Medication Sig  . alprazolam (XANAX) 2 MG tablet Take 2 mg in a.m., 1 mg in afternoon, and 2 mg in p.m.  Marland Kitchen atenolol (TENORMIN) 50 MG tablet TAKE 1 TABLET IN THE MORNING AND 1/2 TABLET IN THE EVENING  . atorvastatin (LIPITOR) 20 MG tablet Take 1 tablet (20 mg total) by mouth daily at 6 PM. Monday & Thursday  . Bisacodyl (DULCOLAX PO) Take by mouth. Reported on 09/24/2015  . Cholecalciferol (VITAMIN D) 2000 UNITS CAPS Take 2,000 Units by mouth daily. Reported on 09/24/2015  . furosemide (LASIX) 40 MG tablet TAKE 1 TABLET DAILY  . levothyroxine (SYNTHROID, LEVOTHROID) 112 MCG tablet TAKE 1 TABLET DAILY  . nystatin cream (MYCOSTATIN) Apply 1 application topically 2 (two) times daily. Apply to affected area BID  . Omega-3 Fatty Acids (FISH OIL PO) Take 1,000 mg by mouth 3 (three) times daily. 2 caos in am, 1 cap in pm  . Polyethylene Glycol 3350 (MIRALAX PO) Take by mouth as directed. Reported on 09/24/2015  . triamcinolone cream (KENALOG) 0.1 % Apply 1 application topically 2 (two) times daily.  . [DISCONTINUED] metroNIDAZOLE (FLAGYL) 500 MG tablet Take 1 tablet (500  mg total) by mouth 2 (two) times daily. (Patient not taking: Reported on 10/03/2015)   No facility-administered encounter medications on file as of 10/03/2015.     Review of Systems  Constitutional: Negative.   HENT: Negative.   Eyes: Negative.   Respiratory: Negative.   Cardiovascular: Negative.   Gastrointestinal: Negative.   Endocrine: Negative.   Genitourinary: Negative.   Musculoskeletal: Negative.        Shoulder pain   Skin: Negative.   Allergic/Immunologic: Negative.   Neurological: Negative.   Hematological: Negative.   Psychiatric/Behavioral: Negative.   All other systems reviewed and are negative.      Objective:   Physical Exam  Constitutional: She appears well-developed and well-nourished.  Musculoskeletal:  Right shoulder: Range of motion is pretty good as his strength internal and external rotation are normal. Point of maximal tenderness is over the superior aspect of the bicipital tendon anteriorly. This was injected with half cc of Depo-Medrol and Marcaine which patient tolerated well    BP 140/75 mmHg  Pulse 74  Temp(Src) 97.5 F (36.4 C) (Oral)  Ht 5\' 6"  (1.676 m)  Wt 196 lb (88.905 kg)  BMI 31.65 kg/m2         Assessment & Plan:  1. Pain in joint of right shoulder I suspect this is some bicipital tendinitis this was injected as described above. Patient tolerated procedure well  Wardell Honour MD

## 2015-10-16 ENCOUNTER — Ambulatory Visit: Payer: Medicare Other | Admitting: Family Medicine

## 2015-10-17 ENCOUNTER — Ambulatory Visit: Payer: Medicare Other | Admitting: Family Medicine

## 2015-10-17 ENCOUNTER — Encounter: Payer: Self-pay | Admitting: Family Medicine

## 2015-10-20 ENCOUNTER — Encounter: Payer: Self-pay | Admitting: Family Medicine

## 2015-10-20 ENCOUNTER — Ambulatory Visit (INDEPENDENT_AMBULATORY_CARE_PROVIDER_SITE_OTHER): Payer: Medicare Other

## 2015-10-20 ENCOUNTER — Ambulatory Visit (INDEPENDENT_AMBULATORY_CARE_PROVIDER_SITE_OTHER): Payer: Medicare Other | Admitting: Family Medicine

## 2015-10-20 VITALS — BP 118/70 | HR 66 | Temp 98.6°F | Ht 66.0 in | Wt 186.4 lb

## 2015-10-20 DIAGNOSIS — M25552 Pain in left hip: Secondary | ICD-10-CM | POA: Diagnosis not present

## 2015-10-20 MED ORDER — NAPROXEN 500 MG PO TABS: 500.0000 mg | ORAL_TABLET | Freq: Two times a day (BID) | ORAL | Status: DC

## 2015-10-20 NOTE — Progress Notes (Addendum)
   HPI  Patient presents today here with left lateral hip/buttock pain.  Patient's lines she's had dull aches the left lateral hip/buttock pain over the last 2 weeks, seems to be getting better after taking scheduled Naprosyn for 3 days. She would like to get back to exercising to lose weight, however she feels this is limiting her.  She denies any injury.  She denies any leg weakness, saddle anesthesia, or radiation down the back of the leg. It is located behind the left greater trochanter in the lateral buttock.  PMH: Smoking status noted ROS: Per HPI  Objective: BP 118/70 mmHg  Pulse 66  Temp(Src) 98.6 F (37 C) (Oral)  Ht 5\' 6"  (1.676 m)  Wt 186 lb 6.4 oz (84.55 kg)  BMI 30.10 kg/m2 Gen: NAD, alert, cooperative with exam HEENT: NCAT CV: RRR, good S1/S2, no murmur Resp: CTABL, no wheezes, non-labored Ext: No edema, warm Neuro: Alert and oriented, No gross deficits  Musculoskeletal Mild tenderness to palpation behind the left greater trochanter lateral buttock Negative Faber test, negative logroll, full flexion of the left hip with no pain.  Plain film of the hip without acute abnormality  Assessment and plan:  # Left lateral hip pain Continue NSAIDs 5 days Ice, stretching, get back to exercise slowly. X-ray today per her request- normal Follow-up if not improving as expected    Orders Placed This Encounter  Procedures  . DG HIP UNILAT W OR W/O PELVIS 2-3 VIEWS LEFT    Standing Status: Future     Number of Occurrences:      Standing Expiration Date: 10/19/2016    Order Specific Question:  Reason for Exam (SYMPTOM  OR DIAGNOSIS REQUIRED)    Answer:  lateral hip/buttock pain    Order Specific Question:  Preferred imaging location?    Answer:  External    Meds ordered this encounter  Medications  . naproxen (NAPROSYN) 500 MG tablet    Sig: Take 1 tablet (500 mg total) by mouth 2 (two) times daily with a meal.    Dispense:  10 tablet    Refill:  0     Laroy Apple, MD [facility] 10/20/2015, 9:03 AM

## 2015-10-20 NOTE — Patient Instructions (Signed)
Great to see you! Continue taking naprosyn for 5 more days, use ice 15 minutes 3 times daily, and get back into regular movement and exercise gently.   Please come back and let us know if you need anything else.   We will call with x ray results.

## 2015-10-21 ENCOUNTER — Ambulatory Visit: Payer: Medicare Other | Admitting: Family Medicine

## 2015-11-05 ENCOUNTER — Encounter: Payer: Self-pay | Admitting: Gastroenterology

## 2015-11-05 ENCOUNTER — Ambulatory Visit (INDEPENDENT_AMBULATORY_CARE_PROVIDER_SITE_OTHER): Payer: Medicare Other | Admitting: Gastroenterology

## 2015-11-05 VITALS — BP 136/64 | HR 88 | Ht 66.0 in | Wt 187.0 lb

## 2015-11-05 DIAGNOSIS — K219 Gastro-esophageal reflux disease without esophagitis: Secondary | ICD-10-CM | POA: Diagnosis not present

## 2015-11-05 DIAGNOSIS — R490 Dysphonia: Secondary | ICD-10-CM | POA: Diagnosis not present

## 2015-11-05 DIAGNOSIS — Z1211 Encounter for screening for malignant neoplasm of colon: Secondary | ICD-10-CM

## 2015-11-05 DIAGNOSIS — R140 Abdominal distension (gaseous): Secondary | ICD-10-CM | POA: Diagnosis not present

## 2015-11-05 MED ORDER — NA SULFATE-K SULFATE-MG SULF 17.5-3.13-1.6 GM/177ML PO SOLN: 1.0000 | Freq: Once | ORAL | 0 refills | Status: AC

## 2015-11-05 MED ORDER — OMEPRAZOLE 40 MG PO CPDR: 40.0000 mg | DELAYED_RELEASE_CAPSULE | Freq: Every day | ORAL | 11 refills | Status: DC

## 2015-11-05 NOTE — Patient Instructions (Signed)
You have been scheduled for a colonoscopy. Please follow written instructions given to you at your visit today.  Please pick up your prep supplies at the pharmacy within the next 1-3 days. If you use inhalers (even only as needed), please bring them with you on the day of your procedure. Your physician has requested that you go to www.startemmi.com and enter the access code given to you at your visit today. This web site gives a general overview about your procedure. However, you should still follow specific instructions given to you by our office regarding your preparation for the procedure.  Use VSL # 3 112 BU daily We will send omeprazole to your pharmacy

## 2015-11-05 NOTE — Progress Notes (Signed)
Heather Leblanc    [id number]    07-Dec-1950  Primary Care Physician:MILLER, Lillette Boxer, MD  Referring Physician: Wardell Honour, MD [address] [place], [place] [id number]  Chief complaint:  Screening colonoscopy, hoarseness of voice  HPI: 65 -year-old female with history of hypothyroidism here to establish care. She was scheduled for screening colonoscopy last year but patient canceled the procedure. Patient said she is afraid of being put to sleep with the procedure and would like to see if she can do the procedure unsedated. Patient is also very poor historian and needed frequent redirection during the entire time as she would go over tangentially to different topics around her life or other medical problems in remote past that are no longer active or unrelated to this visit. Patient did complain of hoarseness of voice for the past 3-4 years but she thinks it's not reflux-related and believes it started around the time when she was homeless, living in a  shelter and believes that the toxic chemicals she was exposed there or the mold led to the hoarseness of voice. She denies any heartburn, regurgitation or acid taste. She does report occasional difficulty with swallowing solid foods where she has to swallow multiple times to get the food down, denies any episodes of choking or food impaction. Denies any nausea, vomiting, abdominal pain, melena or bright red blood per rectum On review of system complain of abdominal bloating and irregular bowel habits. She feels her belly is getting bigger and is concerned about fluid collection or tumor. She has had CT abdomen and pelvis without contrast and 2 abdominal ultrasound which were negative for ascites or any significant acute abnormality. She did gain >20 pounds in past few months, reports no change in diet or lifestyle. Her thyroid levels been stable.    Outpatient Encounter Prescriptions as of 11/05/2015  Medication Sig  . alprazolam  (XANAX) 2 MG tablet Take 2 mg in a.m., 1 mg in afternoon, and 2 mg in p.m.  Marland Kitchen atenolol (TENORMIN) 50 MG tablet TAKE 1 TABLET IN THE MORNING AND 1/2 TABLET IN THE EVENING  . atorvastatin (LIPITOR) 20 MG tablet Take 1 tablet (20 mg total) by mouth daily at 6 PM. Monday & Thursday  . Cholecalciferol (VITAMIN D) 2000 UNITS CAPS Take 2,000 Units by mouth daily. Reported on 09/24/2015  . furosemide (LASIX) 40 MG tablet TAKE 1 TABLET DAILY (Patient taking differently: TAKE 1 PRN)  . levothyroxine (SYNTHROID, LEVOTHROID) 112 MCG tablet TAKE 1 TABLET DAILY  . Omega-3 Fatty Acids (FISH OIL PO) Take 1,000 mg by mouth 3 (three) times daily. 2 caos in am, 1 cap in pm  . [DISCONTINUED] naproxen (NAPROSYN) 500 MG tablet Take 1 tablet (500 mg total) by mouth 2 (two) times daily with a meal.  . [DISCONTINUED] Polyethylene Glycol 3350 (MIRALAX PO) Take by mouth as directed. Reported on 10/20/2015  . Na Sulfate-K Sulfate-Mg Sulf (SUPREP BOWEL PREP KIT) 17.5-3.13-1.6 GM/180ML SOLN Take 1 kit by mouth once.  Marland Kitchen omeprazole (PRILOSEC) 40 MG capsule Take 1 capsule (40 mg total) by mouth daily.   No facility-administered encounter medications on file as of 11/05/2015.     Allergies as of 11/05/2015 - Review Complete 11/05/2015  Allergen Reaction Noted  . Celexa [citalopram hydrobromide] Other (See Comments) 04/25/2014  . Iohexol Other (See Comments) 04/22/2005  . Ivp dye [iodinated diagnostic agents] Other (See Comments) 06/19/2012  . Phenobarbital Other (See Comments) 09/18/2010  .  Shellfish allergy Shortness Of Breath and Swelling 06/19/2012  . Ciprofloxacin Other (See Comments) 01/31/2014  . Tegretol [carbamazepine] Other (See Comments) 09/18/2010  . Cinobac [cinoxacin] Other (See Comments) 06/19/2012  . Penicillins Other (See Comments) 09/18/2010  . Sulfur Rash 11/12/2014    Past Medical History:  Diagnosis Date  . 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 Anxiety   . Cancer ([place])    skin  . Cystitis   . Depression   .  Eczema   . 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 History of phlebitis   . History of seasonal allergies   . Hyperlipidemia   . Hypothyroidism   . Premature menopause     Past Surgical History:  Procedure Laterality Date  . BARTHOLIN GLAND CYST EXCISION  1994  . Right leg vein surgery Right 04/2014    Family History  Problem Relation Age of Onset  . Heart disease Mother     Valvular, RHD  . Varicose Veins Mother   . CAD Father 74  . Colon cancer Neg Hx   . Esophageal cancer Neg Hx   . Stomach cancer Neg Hx   . Rectal cancer Neg Hx     Social History   Social History  . Marital status: Divorced    Spouse name: N/A  . Number of children: 0  . Years of education: N/A   Occupational History  . Not on file.   Social History Main Topics  . Smoking status: Never Smoker  . Smokeless tobacco: Never Used  . Alcohol use No  . Drug use: No  . Sexual activity: Not on file   Other Topics Concern  . Not on file   Social History Narrative   Lives alone.  She has a support group.        Review of systems: Review of Systems  Constitutional: Negative for fever and chills.  HENT: Negative.   Eyes: Negative for blurred vision.  Respiratory: Negative for shortness of breath and wheezing.   positive for intermittent cough Cardiovascular: Negative for chest pain and palpitations.  Gastrointestinal: as per HPI Genitourinary: Negative for dysuria, urgency, frequency and hematuria.  Musculoskeletal: Negative for myalgias, back pain and joint pain.  Skin: Negative for itching and rash.  Neurological: Negative for dizziness, tremors, focal weakness, and loss of consciousness.  remote history of seizures in 1988 Endo/Heme/Allergies: Negative for environmental allergies.  Psychiatric/Behavioral: Negative for depression, suicidal ideas and hallucinations.  All other systems reviewed and are negative.   Physical Exam: Vitals:   11/05/15 0958  BP: 136/64  Pulse: 88    Gen:      No acute distress HEENT:  EOMI, sclera anicteric Neck:     No masses; no thyromegaly Lungs:    Clear to auscultation bilaterally; normal respiratory effort CV:         Regular rate and rhythm; no murmurs Abd:      + bowel sounds; soft, non-tender; no palpable masses, no distension Ext:    No edema; adequate peripheral perfusion Skin:      Warm and dry; no rash Neuro: alert and oriented x 3 Psych: normal mood and affect  Data Reviewed:  Reviewed chart in epic    Assessment and Plan/Recommendations: 65 yr female here to discuss screening colonoscopy and also with complaints of hoarseness of voice and occasional solid dysphagia Patient is very reluctant to undergo sedation for the procedure Explained in detail the process, risks and benefits with  sedation and also endoscopic procedures, we can try to do colonoscopy unsedated as long as patient is able to tolerate the procedure and she is willing to give it a try and also willing to take the sedated medication if she has discomfort or is unable to tolerate. We'll proceed with scheduling for screening colonoscopy  Intermittent hoarseness, cough and solid dysphagia: Could be related to untreated GERD We'll do a trial of PPI once daily for 2 months We will hold off EGD given patient's reluctance to undergo sedation, will consider if she continues to have persistent symptoms despite treatment of GERD She reports allergy to contrast dye and is extremely scared of doing any imaging with contrast, hold off barium esophagogram.   Abdominal bloating and irregular bowel habits: Start probiotic VSL#3, 1 capsule daily  Greater than 50% of the time used for counseling as well as treatment plan and follow-up. She had multiple questions which were answered to her satisfaction    K. Denzil Magnuson , MD [phone] Mon-Fri 8a-5p [phone] after 5p, weekends, holidays  CC: Wardell Honour, MD

## 2015-11-07 ENCOUNTER — Telehealth: Payer: Self-pay | Admitting: Gastroenterology

## 2015-11-10 ENCOUNTER — Other Ambulatory Visit: Payer: Self-pay

## 2015-11-10 MED ORDER — VSL#3 PO CAPS: 1.0000 | ORAL_CAPSULE | Freq: Every day | ORAL | 2 refills | Status: DC

## 2015-11-10 NOTE — Telephone Encounter (Signed)
VSL#3 sent as a prescription to the pharmacy. The patient is aware.

## 2015-11-12 ENCOUNTER — Ambulatory Visit (AMBULATORY_SURGERY_CENTER): Payer: Medicare Other | Admitting: Gastroenterology

## 2015-11-12 ENCOUNTER — Encounter: Payer: Self-pay | Admitting: Gastroenterology

## 2015-11-12 VITALS — BP 124/57 | HR 61 | Temp 98.7°F | Resp 18 | Ht 66.0 in | Wt 187.0 lb

## 2015-11-12 DIAGNOSIS — D122 Benign neoplasm of ascending colon: Secondary | ICD-10-CM | POA: Diagnosis not present

## 2015-11-12 DIAGNOSIS — D128 Benign neoplasm of rectum: Secondary | ICD-10-CM

## 2015-11-12 DIAGNOSIS — K621 Rectal polyp: Secondary | ICD-10-CM | POA: Diagnosis not present

## 2015-11-12 DIAGNOSIS — D129 Benign neoplasm of anus and anal canal: Secondary | ICD-10-CM

## 2015-11-12 DIAGNOSIS — Z1211 Encounter for screening for malignant neoplasm of colon: Secondary | ICD-10-CM | POA: Diagnosis present

## 2015-11-12 MED ORDER — SODIUM CHLORIDE 0.9 % IV SOLN: 500.0000 mL | INTRAVENOUS | Status: DC

## 2015-11-12 NOTE — Progress Notes (Signed)
Pt request no sedation. Pt understands that if she wishes we will proceed with sedation at any time she requests. She understands that should she wish we can stop the preocedure at her request.

## 2015-11-12 NOTE — Op Note (Signed)
[facility] Patient Name: Heather Leblanc Procedure Date: 11/12/2015 7:26 AM MRN: [id number] Endoscopist: Mauri Pole , MD Age: 65 Referring MD:  Date of Birth: 11-Feb-1951 Gender: Female Account #: [id number] Procedure:                Colonoscopy Indications:              Screening for colorectal malignant neoplasm Medicines:                No sedation medications were administered Procedure:                Pre-Anesthesia Assessment:                           - Prior to the procedure, a History and Physical                            was performed, and patient medications and                            allergies were reviewed. The patient's tolerance of                            previous anesthesia was also reviewed. The risks                            and benefits of the procedure and the sedation                            options and risks were discussed with the patient.                            All questions were answered, and informed consent                            was obtained. Prior Anticoagulants: The patient has                            taken no previous anticoagulant or antiplatelet                            agents. ASA Grade Assessment: II - A patient with                            mild systemic disease. After reviewing the risks                            and benefits, the patient was deemed in                            satisfactory condition to undergo the procedure.                           After obtaining informed consent, the colonoscope  was passed under direct vision. Throughout the                            procedure, the patient's blood pressure, pulse, and                            oxygen saturations were monitored continuously. The                            Model CF-HQ190L [id number]) scope was introduced                            through the anus and advanced to the the cecum,   identified by appendiceal orifice and ileocecal                            valve. The colonoscopy was performed without                            difficulty. The patient tolerated the procedure                            well. The quality of the bowel preparation was                            excellent. The ileocecal valve, appendiceal                            orifice, and rectum were photographed. Scope In: 8:16:33 AM Scope Out: 8:38:11 AM Scope Withdrawal Time: 0 hours 13 minutes 56 seconds  Total Procedure Duration: 0 hours 21 minutes 38 seconds  Findings:                 The perianal and digital rectal examinations were                            normal.                           A 2 mm polyp was found in the ascending colon. The                            polyp was sessile. The polyp was removed with a                            cold biopsy forceps. Resection and retrieval were                            complete.                           Two sessile polyps were found in the rectum and                            ascending colon. The polyps were 5 to  8 mm in size.                            These polyps were removed with a cold snare.                            Resection and retrieval were complete.                           Non-bleeding internal hemorrhoids were found during                            retroflexion. The hemorrhoids were small.                           A few small-mouthed diverticula were found in the                            sigmoid colon and descending colon.                           The exam was otherwise without abnormality. Complications:            No immediate complications. Estimated Blood Loss:     Estimated blood loss was minimal. Impression:               - One 2 mm polyp in the ascending colon, removed                            with a cold biopsy forceps. Resected and retrieved.                           - Two 5 to 8 mm polyps in the rectum and  in the                            ascending colon, removed with a cold snare.                            Resected and retrieved.                           - Non-bleeding internal hemorrhoids.                           - Diverticulosis in the sigmoid colon and in the                            descending colon.                           - The examination was otherwise normal. Recommendation:           - Patient has a contact number available for                            emergencies. The signs and  symptoms of potential                            delayed complications were discussed with the                            patient. Return to normal activities tomorrow.                            Written discharge instructions were provided to the                            patient.                           - Resume previous diet.                           - Continue present medications.                           - Await pathology results.                           - Repeat colonoscopy in 3 - 5 years for                            surveillance.                           - Return to GI clinic PRN. Mauri Pole, MD 11/12/2015 8:45:55 AM This report has been signed electronically.

## 2015-11-12 NOTE — Progress Notes (Signed)
Called to room to assist during endoscopic procedure.  Patient ID and intended procedure confirmed with present staff. Received instructions for my participation in the procedure from the performing physician.  

## 2015-11-12 NOTE — Patient Instructions (Signed)

## 2015-11-12 NOTE — Progress Notes (Signed)
Procedure ends, pt to recovery, report to Nicki Reaper, Therapist, sports, [facility]

## 2015-11-13 ENCOUNTER — Telehealth: Payer: Self-pay | Admitting: *Deleted

## 2015-11-13 NOTE — Telephone Encounter (Signed)
  Follow up Call-  Call back number 11/12/2015  Post procedure Call Back phone  # [phone]  Permission to leave phone message No  Some recent data might be hidden     Patient questions:  Do you have a fever, pain , or abdominal swelling? No. Pain Score  0 *  Have you tolerated food without any problems? Yes.    Have you been able to return to your normal activities? Yes.    Do you have any questions about your discharge instructions: Diet   No. Medications  No. Follow up visit  No.  Do you have questions or concerns about your Care? No.  Actions: * If pain score is 4 or above: No action needed, pain <4.

## 2015-11-19 ENCOUNTER — Encounter: Payer: Self-pay | Admitting: Gastroenterology

## 2015-12-02 ENCOUNTER — Ambulatory Visit: Payer: Medicare Other | Admitting: Family Medicine

## 2015-12-03 ENCOUNTER — Ambulatory Visit (INDEPENDENT_AMBULATORY_CARE_PROVIDER_SITE_OTHER): Payer: Medicare Other | Admitting: Family Medicine

## 2015-12-03 ENCOUNTER — Encounter: Payer: Self-pay | Admitting: Family Medicine

## 2015-12-03 ENCOUNTER — Ambulatory Visit (INDEPENDENT_AMBULATORY_CARE_PROVIDER_SITE_OTHER): Payer: Medicare Other

## 2015-12-03 VITALS — BP 118/66 | HR 73 | Temp 98.1°F | Ht 66.0 in | Wt 190.0 lb

## 2015-12-03 DIAGNOSIS — Z780 Asymptomatic menopausal state: Secondary | ICD-10-CM

## 2015-12-03 DIAGNOSIS — E039 Hypothyroidism, unspecified: Secondary | ICD-10-CM | POA: Diagnosis not present

## 2015-12-03 DIAGNOSIS — Z13820 Encounter for screening for osteoporosis: Secondary | ICD-10-CM

## 2015-12-03 DIAGNOSIS — F411 Generalized anxiety disorder: Secondary | ICD-10-CM | POA: Diagnosis not present

## 2015-12-03 DIAGNOSIS — E785 Hyperlipidemia, unspecified: Secondary | ICD-10-CM | POA: Diagnosis not present

## 2015-12-03 MED ORDER — ALPRAZOLAM 2 MG PO TABS: ORAL_TABLET | ORAL | 3 refills | Status: DC

## 2015-12-03 MED ORDER — ATORVASTATIN CALCIUM 20 MG PO TABS: 20.0000 mg | ORAL_TABLET | Freq: Every day | ORAL | 5 refills | Status: DC

## 2015-12-03 NOTE — Progress Notes (Signed)
Subjective:    Patient ID: Heather Leblanc, female    DOB: Sep 07, 1950, 65 y.o.   MRN: [id number]  HPI Pt here for follow up and management of chronic medical problems which includes hypothyroid and hyperlipidemia. She is taking medications regularly.  Since her last visit here she has had colonoscopy which was negative. This occurred after some cancellations and other issues. She needs a refill on her Xanax today which she needs. She is also having more palpitations recently but symptoms have generally been fairly well controlled on the beta blocker, atenolol and she takes that on an as-needed basis    Patient Active Problem List   Diagnosis Date Noted  . Palpitations 04/17/2015  . Palpitation 07/10/2014  . Urinary tract infectious disease 01/31/2014  . Hydradenitis 01/31/2014  . Peripheral edema 12/03/2013  . White coat syndrome without hypertension 12/03/2013  . Hypothyroidism 12/08/2012  . GAD (generalized anxiety disorder) 12/08/2012   Outpatient Encounter Prescriptions as of 12/03/2015  Medication Sig  . alprazolam (XANAX) 2 MG tablet Take 2 mg in a.m., 1 mg in afternoon, and 2 mg in p.m.  Marland Kitchen atenolol (TENORMIN) 50 MG tablet TAKE 1 TABLET IN THE MORNING AND 1/2 TABLET IN THE EVENING  . atorvastatin (LIPITOR) 20 MG tablet Take 1 tablet (20 mg total) by mouth daily at 6 PM. Monday & Thursday  . Cholecalciferol (VITAMIN D) 2000 UNITS CAPS Take 2,000 Units by mouth daily. Reported on 09/24/2015  . furosemide (LASIX) 40 MG tablet TAKE 1 TABLET DAILY (Patient taking differently: TAKE 1 PRN)  . levothyroxine (SYNTHROID, LEVOTHROID) 112 MCG tablet TAKE 1 TABLET DAILY  . Omega-3 Fatty Acids (FISH OIL PO) Take 1,000 mg by mouth 3 (three) times daily. 2 caos in am, 1 cap in pm  . Probiotic Product (VSL#3) CAPS Take 1 capsule by mouth daily.  . [DISCONTINUED] omeprazole (PRILOSEC) 40 MG capsule Take 1 capsule (40 mg total) by mouth daily.  . [DISCONTINUED] 0.9 %  sodium chloride infusion     No facility-administered encounter medications on file as of 12/03/2015.      Review of Systems  Constitutional: Negative.   HENT: Negative.   Eyes: Negative.   Respiratory: Negative.   Cardiovascular: Positive for palpitations (question about atenolol).  Gastrointestinal: Negative.   Endocrine: Negative.   Genitourinary: Negative.        Recently had colon  Musculoskeletal: Negative.   Skin: Negative.   Allergic/Immunologic: Negative.   Neurological: Negative.   Hematological: Negative.   Psychiatric/Behavioral: Negative.        Objective:   Physical Exam  Constitutional: She is oriented to person, place, and time. She appears well-developed and well-nourished.  Cardiovascular: Normal rate, regular rhythm and normal heart sounds.   Pulmonary/Chest: Effort normal and breath sounds normal.  Neurological: She is alert and oriented to person, place, and time.  Psychiatric: She has a normal mood and affect. Her behavior is normal.   BP 118/66 (BP Location: Right Arm)   Pulse 73   Temp 98.1 F (36.7 C) (Oral)   Ht 5\' 6"  (1.676 m)   Wt 190 lb (86.2 kg)   SpO2 98%   BMI 30.67 kg/m         Assessment & Plan:  1. Hypothyroidism, unspecified hypothyroidism type TSH was therapeutic last checked within the year. Continue supplement  2. Hyperlipidemia We reinitiated statin therapy about 6 months ago. Will recheck a yearly intervals  3. Screening for osteoporosis  - DG [facility] DEXA  4.  Postmenopausal  - DG [facility] DEXA  5. GAD (generalized anxiety disorder) Refill Xanax 2 mg twice a day   Wardell Honour MD

## 2015-12-03 NOTE — Patient Instructions (Signed)
Continue current medications. Continue good therapeutic lifestyle changes which include good diet and exercise. Fall precautions discussed with patient. If an FOBT was given today- please return it to our front desk. If you are over 65 years old - you may need Prevnar 13 or the adult Pneumonia vaccine.   After your visit with us today you will receive a survey in the mail or online from [employer] regarding your care with us. Please take a moment to fill this out. Your feedback is very important to us as you can help us better understand your patient needs as well as improve your experience and satisfaction. WE CARE ABOUT YOU!!!    

## 2015-12-18 ENCOUNTER — Encounter: Payer: Self-pay | Admitting: Gastroenterology

## 2015-12-18 ENCOUNTER — Telehealth: Payer: Self-pay | Admitting: Gastroenterology

## 2015-12-19 NOTE — Telephone Encounter (Signed)
Pathology report mailed today

## 2015-12-31 ENCOUNTER — Encounter: Payer: Self-pay | Admitting: Family Medicine

## 2015-12-31 ENCOUNTER — Ambulatory Visit (INDEPENDENT_AMBULATORY_CARE_PROVIDER_SITE_OTHER): Payer: Medicare Other | Admitting: Family Medicine

## 2015-12-31 VITALS — BP 128/87 | HR 87 | Temp 98.1°F | Ht 66.0 in | Wt 188.4 lb

## 2015-12-31 DIAGNOSIS — R002 Palpitations: Secondary | ICD-10-CM | POA: Diagnosis not present

## 2015-12-31 NOTE — Progress Notes (Signed)
   Subjective:    Patient ID: Heather Leblanc, female    DOB: 06/09/1950, 65 y.o.   MRN: [id number]  HPI  Patient here today with complaints of heart palpitations.This patient has a history of palpitations and she had been doing pretty good on atenolol. When asked what was different this week she could not come up with anything she talked about taking some probiotics if that's not likely a calls the more we talked the more things came out. This weekend was anniversary of her mother's death 15 years ago and that's been on her mind and then she had a friend commit suicide over the weekend I think these things have triggered more stress and thus palpitations and I tried to reassure her that everything is unchanged.       Patient Active Problem List   Diagnosis Date Noted  . Palpitations 04/17/2015  . Palpitation 07/10/2014  . Urinary tract infectious disease 01/31/2014  . Hydradenitis 01/31/2014  . Peripheral edema 12/03/2013  . White coat syndrome without hypertension 12/03/2013  . Hypothyroidism 12/08/2012  . GAD (generalized anxiety disorder) 12/08/2012   Outpatient Encounter Prescriptions as of 12/31/2015  Medication Sig  . alprazolam (XANAX) 2 MG tablet Take 2 mg in a.m., 1 mg in afternoon, and 2 mg in p.m.  Marland Kitchen atenolol (TENORMIN) 50 MG tablet TAKE 1 TABLET IN THE MORNING AND 1/2 TABLET IN THE EVENING  . atorvastatin (LIPITOR) 20 MG tablet Take 1 tablet (20 mg total) by mouth daily at 6 PM. Monday & Thursday  . Cholecalciferol (VITAMIN D) 2000 UNITS CAPS Take 2,000 Units by mouth daily. Reported on 09/24/2015  . furosemide (LASIX) 40 MG tablet TAKE 1 TABLET DAILY (Patient taking differently: TAKE 1 PRN)  . levothyroxine (SYNTHROID, LEVOTHROID) 112 MCG tablet TAKE 1 TABLET DAILY  . Omega-3 Fatty Acids (FISH OIL PO) Take 1,000 mg by mouth 3 (three) times daily. 2 caos in am, 1 cap in pm  . Probiotic Product (VSL#3) CAPS Take 1 capsule by mouth daily.   No facility-administered encounter  medications on file as of 12/31/2015.      Review of Systems  Constitutional: Negative.   HENT: Negative.   Eyes: Negative.   Respiratory: Negative.   Cardiovascular: Positive for palpitations.  Gastrointestinal: Negative.   Endocrine: Negative.   Genitourinary: Negative.   Musculoskeletal: Negative.   Skin: Negative.   Allergic/Immunologic: Negative.   Neurological: Negative.   Hematological: Negative.   Psychiatric/Behavioral: Negative.        Objective:   Physical Exam  Constitutional: She is oriented to person, place, and time. She appears well-developed and well-nourished.  Cardiovascular: Normal rate, regular rhythm and normal heart sounds.  Exam reveals no gallop.   Pulmonary/Chest: Effort normal and breath sounds normal.  Neurological: She is alert and oriented to person, place, and time.   BP 128/87   Pulse 87   Temp 98.1 F (36.7 C) (Oral)   Ht 5\' 6"  (1.676 m)   Wt 188 lb 6.4 oz (85.5 kg)   BMI 30.41 kg/m         Assessment & Plan:  1. Palpitations EKG is normal exam is normal. I think if she is having more palpitations they're related to stress. Reassure. Continue taking medicines as before. I do think it's important to see her on a regular basis to talk.  Wardell Honour MD - EKG 12-Lead

## 2016-01-11 DIAGNOSIS — R4182 Altered mental status, unspecified: Secondary | ICD-10-CM

## 2016-01-11 HISTORY — DX: Altered mental status, unspecified: R41.82

## 2016-01-13 NOTE — Progress Notes (Deleted)
   Subjective:    Patient ID: Heather Leblanc, female    DOB: Feb 23, 1951, 65 y.o.   MRN: [id number]  HPI    Review of Systems     Objective:   Physical Exam        Assessment & Plan:

## 2016-01-14 ENCOUNTER — Encounter: Payer: Self-pay | Admitting: *Deleted

## 2016-01-14 ENCOUNTER — Ambulatory Visit: Payer: Medicare Other | Admitting: Family Medicine

## 2016-01-14 DIAGNOSIS — S06349A Traumatic hemorrhage of right cerebrum with loss of consciousness of unspecified duration, initial encounter: Secondary | ICD-10-CM | POA: Diagnosis not present

## 2016-01-14 DIAGNOSIS — S0083XA Contusion of other part of head, initial encounter: Secondary | ICD-10-CM | POA: Diagnosis not present

## 2016-01-14 DIAGNOSIS — S92901A Unspecified fracture of right foot, initial encounter for closed fracture: Secondary | ICD-10-CM | POA: Insufficient documentation

## 2016-01-14 DIAGNOSIS — S080XXA Avulsion of scalp, initial encounter: Secondary | ICD-10-CM | POA: Diagnosis not present

## 2016-01-14 DIAGNOSIS — R740 Nonspecific elevation of levels of transaminase and lactic acid dehydrogenase [LDH]: Secondary | ICD-10-CM

## 2016-01-14 DIAGNOSIS — E639 Nutritional deficiency, unspecified: Secondary | ICD-10-CM | POA: Diagnosis not present

## 2016-01-14 DIAGNOSIS — E872 Acidosis: Secondary | ICD-10-CM | POA: Diagnosis not present

## 2016-01-14 DIAGNOSIS — J9600 Acute respiratory failure, unspecified whether with hypoxia or hypercapnia: Secondary | ICD-10-CM | POA: Diagnosis not present

## 2016-01-14 DIAGNOSIS — D62 Acute posthemorrhagic anemia: Secondary | ICD-10-CM | POA: Diagnosis not present

## 2016-01-14 DIAGNOSIS — S0282XA Fracture of other specified skull and facial bones, left side, initial encounter for closed fracture: Secondary | ICD-10-CM | POA: Diagnosis not present

## 2016-01-14 DIAGNOSIS — R0682 Tachypnea, not elsewhere classified: Secondary | ICD-10-CM | POA: Diagnosis not present

## 2016-01-14 DIAGNOSIS — I6523 Occlusion and stenosis of bilateral carotid arteries: Secondary | ICD-10-CM | POA: Diagnosis not present

## 2016-01-14 DIAGNOSIS — S92022A Displaced fracture of anterior process of left calcaneus, initial encounter for closed fracture: Secondary | ICD-10-CM | POA: Diagnosis not present

## 2016-01-14 DIAGNOSIS — S12040A Displaced lateral mass fracture of first cervical vertebra, initial encounter for closed fracture: Secondary | ICD-10-CM | POA: Diagnosis not present

## 2016-01-14 DIAGNOSIS — S06829A Injury of left internal carotid artery, intracranial portion, not elsewhere classified with loss of consciousness of unspecified duration, initial encounter: Secondary | ICD-10-CM | POA: Diagnosis not present

## 2016-01-14 DIAGNOSIS — S50312A Abrasion of left elbow, initial encounter: Secondary | ICD-10-CM | POA: Diagnosis not present

## 2016-01-14 DIAGNOSIS — I499 Cardiac arrhythmia, unspecified: Secondary | ICD-10-CM | POA: Diagnosis not present

## 2016-01-14 DIAGNOSIS — I959 Hypotension, unspecified: Secondary | ICD-10-CM | POA: Diagnosis not present

## 2016-01-14 DIAGNOSIS — S06819A Injury of right internal carotid artery, intracranial portion, not elsewhere classified with loss of consciousness of unspecified duration, initial encounter: Secondary | ICD-10-CM | POA: Diagnosis not present

## 2016-01-14 DIAGNOSIS — S0240FA Zygomatic fracture, left side, initial encounter for closed fracture: Secondary | ICD-10-CM | POA: Diagnosis not present

## 2016-01-14 DIAGNOSIS — R000 Tachycardia, unspecified: Secondary | ICD-10-CM | POA: Diagnosis not present

## 2016-01-14 DIAGNOSIS — E878 Other disorders of electrolyte and fluid balance, not elsewhere classified: Secondary | ICD-10-CM | POA: Diagnosis not present

## 2016-01-14 DIAGNOSIS — L089 Local infection of the skin and subcutaneous tissue, unspecified: Secondary | ICD-10-CM | POA: Diagnosis not present

## 2016-01-14 DIAGNOSIS — S15109A Unspecified injury of unspecified vertebral artery, initial encounter: Secondary | ICD-10-CM | POA: Diagnosis not present

## 2016-01-14 DIAGNOSIS — J810 Acute pulmonary edema: Secondary | ICD-10-CM | POA: Diagnosis not present

## 2016-01-14 DIAGNOSIS — S12030A Displaced posterior arch fracture of first cervical vertebra, initial encounter for closed fracture: Secondary | ICD-10-CM | POA: Diagnosis not present

## 2016-01-14 DIAGNOSIS — I10 Essential (primary) hypertension: Secondary | ICD-10-CM | POA: Diagnosis not present

## 2016-01-14 DIAGNOSIS — D72829 Elevated white blood cell count, unspecified: Secondary | ICD-10-CM | POA: Diagnosis not present

## 2016-01-14 DIAGNOSIS — S0101XA Laceration without foreign body of scalp, initial encounter: Secondary | ICD-10-CM | POA: Diagnosis not present

## 2016-01-14 DIAGNOSIS — S1202XG Unstable burst fracture of first cervical vertebra, subsequent encounter for fracture with delayed healing: Secondary | ICD-10-CM | POA: Insufficient documentation

## 2016-01-14 DIAGNOSIS — E870 Hyperosmolality and hypernatremia: Secondary | ICD-10-CM | POA: Diagnosis not present

## 2016-01-14 DIAGNOSIS — S82452A Displaced comminuted fracture of shaft of left fibula, initial encounter for closed fracture: Secondary | ICD-10-CM | POA: Diagnosis not present

## 2016-01-14 DIAGNOSIS — S15102A Unspecified injury of left vertebral artery, initial encounter: Secondary | ICD-10-CM | POA: Insufficient documentation

## 2016-01-14 DIAGNOSIS — S80212A Abrasion, left knee, initial encounter: Secondary | ICD-10-CM | POA: Diagnosis not present

## 2016-01-14 DIAGNOSIS — Z4659 Encounter for fitting and adjustment of other gastrointestinal appliance and device: Secondary | ICD-10-CM | POA: Diagnosis not present

## 2016-01-14 DIAGNOSIS — S82142A Displaced bicondylar fracture of left tibia, initial encounter for closed fracture: Secondary | ICD-10-CM | POA: Diagnosis not present

## 2016-01-14 DIAGNOSIS — S92002A Unspecified fracture of left calcaneus, initial encounter for closed fracture: Secondary | ICD-10-CM | POA: Insufficient documentation

## 2016-01-14 DIAGNOSIS — S02401A Maxillary fracture, unspecified, initial encounter for closed fracture: Secondary | ICD-10-CM | POA: Insufficient documentation

## 2016-01-14 DIAGNOSIS — S06369A Traumatic hemorrhage of cerebrum, unspecified, with loss of consciousness of unspecified duration, initial encounter: Secondary | ICD-10-CM | POA: Diagnosis not present

## 2016-01-14 DIAGNOSIS — S92342A Displaced fracture of fourth metatarsal bone, left foot, initial encounter for closed fracture: Secondary | ICD-10-CM | POA: Diagnosis not present

## 2016-01-14 DIAGNOSIS — S22009A Unspecified fracture of unspecified thoracic vertebra, initial encounter for closed fracture: Secondary | ICD-10-CM | POA: Insufficient documentation

## 2016-01-14 DIAGNOSIS — S15002A Unspecified injury of left carotid artery, initial encounter: Secondary | ICD-10-CM | POA: Insufficient documentation

## 2016-01-14 DIAGNOSIS — S0101XD Laceration without foreign body of scalp, subsequent encounter: Secondary | ICD-10-CM | POA: Diagnosis not present

## 2016-01-14 DIAGNOSIS — R4182 Altered mental status, unspecified: Secondary | ICD-10-CM | POA: Insufficient documentation

## 2016-01-14 DIAGNOSIS — S42102A Fracture of unspecified part of scapula, left shoulder, initial encounter for closed fracture: Secondary | ICD-10-CM | POA: Diagnosis not present

## 2016-01-14 DIAGNOSIS — E441 Mild protein-calorie malnutrition: Secondary | ICD-10-CM | POA: Diagnosis not present

## 2016-01-14 DIAGNOSIS — S2243XA Multiple fractures of ribs, bilateral, initial encounter for closed fracture: Secondary | ICD-10-CM | POA: Diagnosis not present

## 2016-01-14 DIAGNOSIS — S93125A Dislocation of metatarsophalangeal joint of left lesser toe(s), initial encounter: Secondary | ICD-10-CM | POA: Diagnosis not present

## 2016-01-14 DIAGNOSIS — S0280XA Fracture of other specified skull and facial bones, unspecified side, initial encounter for closed fracture: Secondary | ICD-10-CM | POA: Insufficient documentation

## 2016-01-14 DIAGNOSIS — S27321A Contusion of lung, unilateral, initial encounter: Secondary | ICD-10-CM | POA: Diagnosis not present

## 2016-01-14 DIAGNOSIS — I771 Stricture of artery: Secondary | ICD-10-CM | POA: Diagnosis not present

## 2016-01-14 DIAGNOSIS — J90 Pleural effusion, not elsewhere classified: Secondary | ICD-10-CM | POA: Diagnosis not present

## 2016-01-14 DIAGNOSIS — E785 Hyperlipidemia, unspecified: Secondary | ICD-10-CM | POA: Diagnosis not present

## 2016-01-14 DIAGNOSIS — Z9689 Presence of other specified functional implants: Secondary | ICD-10-CM | POA: Diagnosis not present

## 2016-01-14 DIAGNOSIS — Z9911 Dependence on respirator [ventilator] status: Secondary | ICD-10-CM | POA: Diagnosis not present

## 2016-01-14 DIAGNOSIS — S0240DA Maxillary fracture, left side, initial encounter for closed fracture: Secondary | ICD-10-CM | POA: Diagnosis not present

## 2016-01-14 DIAGNOSIS — S92332A Displaced fracture of third metatarsal bone, left foot, initial encounter for closed fracture: Secondary | ICD-10-CM | POA: Diagnosis not present

## 2016-01-14 DIAGNOSIS — S15001A Unspecified injury of right carotid artery, initial encounter: Secondary | ICD-10-CM | POA: Insufficient documentation

## 2016-01-14 DIAGNOSIS — S42101A Fracture of unspecified part of scapula, right shoulder, initial encounter for closed fracture: Secondary | ICD-10-CM | POA: Diagnosis not present

## 2016-01-14 DIAGNOSIS — R0902 Hypoxemia: Secondary | ICD-10-CM | POA: Diagnosis not present

## 2016-01-14 DIAGNOSIS — R7401 Elevation of levels of liver transaminase levels: Secondary | ICD-10-CM | POA: Insufficient documentation

## 2016-01-14 DIAGNOSIS — J9601 Acute respiratory failure with hypoxia: Secondary | ICD-10-CM | POA: Diagnosis not present

## 2016-01-14 DIAGNOSIS — S2220XA Unspecified fracture of sternum, initial encounter for closed fracture: Secondary | ICD-10-CM | POA: Diagnosis not present

## 2016-01-14 DIAGNOSIS — I9589 Other hypotension: Secondary | ICD-10-CM | POA: Diagnosis not present

## 2016-01-14 DIAGNOSIS — S0990XA Unspecified injury of head, initial encounter: Secondary | ICD-10-CM

## 2016-01-14 DIAGNOSIS — B370 Candidal stomatitis: Secondary | ICD-10-CM | POA: Diagnosis not present

## 2016-01-14 DIAGNOSIS — R918 Other nonspecific abnormal finding of lung field: Secondary | ICD-10-CM | POA: Diagnosis not present

## 2016-01-14 DIAGNOSIS — I7771 Dissection of carotid artery: Secondary | ICD-10-CM | POA: Diagnosis not present

## 2016-01-14 DIAGNOSIS — S22029A Unspecified fracture of second thoracic vertebra, initial encounter for closed fracture: Secondary | ICD-10-CM | POA: Diagnosis not present

## 2016-01-14 DIAGNOSIS — S0100XA Unspecified open wound of scalp, initial encounter: Secondary | ICD-10-CM | POA: Diagnosis not present

## 2016-01-14 DIAGNOSIS — Z743 Need for continuous supervision: Secondary | ICD-10-CM | POA: Diagnosis not present

## 2016-01-14 DIAGNOSIS — T148XXA Other injury of unspecified body region, initial encounter: Secondary | ICD-10-CM | POA: Diagnosis not present

## 2016-01-14 DIAGNOSIS — J151 Pneumonia due to Pseudomonas: Secondary | ICD-10-CM | POA: Diagnosis not present

## 2016-01-14 DIAGNOSIS — S022XXA Fracture of nasal bones, initial encounter for closed fracture: Secondary | ICD-10-CM | POA: Diagnosis not present

## 2016-01-14 DIAGNOSIS — S0219XA Other fracture of base of skull, initial encounter for closed fracture: Secondary | ICD-10-CM | POA: Diagnosis not present

## 2016-01-14 DIAGNOSIS — R001 Bradycardia, unspecified: Secondary | ICD-10-CM | POA: Diagnosis not present

## 2016-01-14 DIAGNOSIS — T07XXXA Unspecified multiple injuries, initial encounter: Secondary | ICD-10-CM | POA: Diagnosis not present

## 2016-01-14 DIAGNOSIS — R6510 Systemic inflammatory response syndrome (SIRS) of non-infectious origin without acute organ dysfunction: Secondary | ICD-10-CM | POA: Diagnosis not present

## 2016-01-14 DIAGNOSIS — I720 Aneurysm of carotid artery: Secondary | ICD-10-CM | POA: Diagnosis not present

## 2016-01-14 DIAGNOSIS — S1202XA Unstable burst fracture of first cervical vertebra, initial encounter for closed fracture: Secondary | ICD-10-CM | POA: Diagnosis not present

## 2016-01-14 DIAGNOSIS — S82101A Unspecified fracture of upper end of right tibia, initial encounter for closed fracture: Secondary | ICD-10-CM | POA: Diagnosis not present

## 2016-01-14 DIAGNOSIS — I671 Cerebral aneurysm, nonruptured: Secondary | ICD-10-CM | POA: Diagnosis not present

## 2016-01-14 DIAGNOSIS — S82141A Displaced bicondylar fracture of right tibia, initial encounter for closed fracture: Secondary | ICD-10-CM | POA: Diagnosis not present

## 2016-01-14 DIAGNOSIS — Z4682 Encounter for fitting and adjustment of non-vascular catheter: Secondary | ICD-10-CM | POA: Diagnosis not present

## 2016-01-14 DIAGNOSIS — S12190A Other displaced fracture of second cervical vertebra, initial encounter for closed fracture: Secondary | ICD-10-CM | POA: Diagnosis not present

## 2016-01-14 DIAGNOSIS — S8252XA Displaced fracture of medial malleolus of left tibia, initial encounter for closed fracture: Secondary | ICD-10-CM | POA: Diagnosis not present

## 2016-01-14 DIAGNOSIS — R402421 Glasgow coma scale score 9-12, in the field [EMT or ambulance]: Secondary | ICD-10-CM | POA: Diagnosis not present

## 2016-01-14 DIAGNOSIS — Z930 Tracheostomy status: Secondary | ICD-10-CM | POA: Diagnosis not present

## 2016-01-14 DIAGNOSIS — S92222A Displaced fracture of lateral cuneiform of left foot, initial encounter for closed fracture: Secondary | ICD-10-CM | POA: Diagnosis not present

## 2016-01-14 DIAGNOSIS — S06339A Contusion and laceration of cerebrum, unspecified, with loss of consciousness of unspecified duration, initial encounter: Secondary | ICD-10-CM | POA: Diagnosis not present

## 2016-01-14 DIAGNOSIS — R509 Fever, unspecified: Secondary | ICD-10-CM | POA: Diagnosis not present

## 2016-01-14 DIAGNOSIS — S82124D Nondisplaced fracture of lateral condyle of right tibia, subsequent encounter for closed fracture with routine healing: Secondary | ICD-10-CM | POA: Diagnosis not present

## 2016-01-14 DIAGNOSIS — I6522 Occlusion and stenosis of left carotid artery: Secondary | ICD-10-CM | POA: Diagnosis not present

## 2016-01-14 DIAGNOSIS — Z041 Encounter for examination and observation following transport accident: Secondary | ICD-10-CM | POA: Diagnosis not present

## 2016-01-14 DIAGNOSIS — J189 Pneumonia, unspecified organism: Secondary | ICD-10-CM | POA: Diagnosis not present

## 2016-01-14 DIAGNOSIS — Z452 Encounter for adjustment and management of vascular access device: Secondary | ICD-10-CM | POA: Diagnosis not present

## 2016-01-14 DIAGNOSIS — Z430 Encounter for attention to tracheostomy: Secondary | ICD-10-CM | POA: Diagnosis not present

## 2016-01-14 DIAGNOSIS — R1310 Dysphagia, unspecified: Secondary | ICD-10-CM | POA: Diagnosis not present

## 2016-01-14 DIAGNOSIS — E46 Unspecified protein-calorie malnutrition: Secondary | ICD-10-CM | POA: Diagnosis not present

## 2016-01-14 DIAGNOSIS — S82201A Unspecified fracture of shaft of right tibia, initial encounter for closed fracture: Secondary | ICD-10-CM | POA: Insufficient documentation

## 2016-01-14 DIAGNOSIS — S92102A Unspecified fracture of left talus, initial encounter for closed fracture: Secondary | ICD-10-CM | POA: Diagnosis not present

## 2016-01-14 DIAGNOSIS — S0285XA Fracture of orbit, unspecified, initial encounter for closed fracture: Secondary | ICD-10-CM | POA: Insufficient documentation

## 2016-01-14 DIAGNOSIS — I7774 Dissection of vertebral artery: Secondary | ICD-10-CM | POA: Diagnosis not present

## 2016-01-14 DIAGNOSIS — S15101A Unspecified injury of right vertebral artery, initial encounter: Secondary | ICD-10-CM | POA: Diagnosis not present

## 2016-01-14 DIAGNOSIS — J984 Other disorders of lung: Secondary | ICD-10-CM | POA: Diagnosis not present

## 2016-01-14 DIAGNOSIS — J690 Pneumonitis due to inhalation of food and vomit: Secondary | ICD-10-CM | POA: Diagnosis not present

## 2016-01-14 DIAGNOSIS — S15009A Unspecified injury of unspecified carotid artery, initial encounter: Secondary | ICD-10-CM

## 2016-01-14 DIAGNOSIS — J9811 Atelectasis: Secondary | ICD-10-CM | POA: Diagnosis not present

## 2016-01-14 HISTORY — DX: Unspecified multiple injuries, initial encounter: T07.XXXA

## 2016-01-14 HISTORY — DX: Unspecified injury of head, initial encounter: S09.90XA

## 2016-01-14 HISTORY — DX: Unspecified injury of unspecified carotid artery, initial encounter: S15.009A

## 2016-01-19 HISTORY — PX: CEREBRAL ANGIOGRAM: SHX1326

## 2016-01-29 HISTORY — PX: TRACHEOSTOMY: SUR1362

## 2016-02-25 DIAGNOSIS — J9621 Acute and chronic respiratory failure with hypoxia: Secondary | ICD-10-CM | POA: Diagnosis not present

## 2016-02-25 DIAGNOSIS — J9620 Acute and chronic respiratory failure, unspecified whether with hypoxia or hypercapnia: Secondary | ICD-10-CM | POA: Diagnosis not present

## 2016-02-25 DIAGNOSIS — S22021A Stable burst fracture of second thoracic vertebra, initial encounter for closed fracture: Secondary | ICD-10-CM | POA: Diagnosis not present

## 2016-02-25 DIAGNOSIS — S022XXA Fracture of nasal bones, initial encounter for closed fracture: Secondary | ICD-10-CM | POA: Diagnosis not present

## 2016-02-25 DIAGNOSIS — S82141A Displaced bicondylar fracture of right tibia, initial encounter for closed fracture: Secondary | ICD-10-CM | POA: Diagnosis not present

## 2016-02-25 DIAGNOSIS — S0101XD Laceration without foreign body of scalp, subsequent encounter: Secondary | ICD-10-CM | POA: Diagnosis not present

## 2016-02-25 DIAGNOSIS — E039 Hypothyroidism, unspecified: Secondary | ICD-10-CM | POA: Diagnosis not present

## 2016-02-25 DIAGNOSIS — S92332D Displaced fracture of third metatarsal bone, left foot, subsequent encounter for fracture with routine healing: Secondary | ICD-10-CM | POA: Diagnosis not present

## 2016-02-25 DIAGNOSIS — R1312 Dysphagia, oropharyngeal phase: Secondary | ICD-10-CM | POA: Diagnosis not present

## 2016-02-25 DIAGNOSIS — J90 Pleural effusion, not elsewhere classified: Secondary | ICD-10-CM | POA: Diagnosis not present

## 2016-02-25 DIAGNOSIS — S32021A Stable burst fracture of second lumbar vertebra, initial encounter for closed fracture: Secondary | ICD-10-CM | POA: Diagnosis not present

## 2016-02-25 DIAGNOSIS — E119 Type 2 diabetes mellitus without complications: Secondary | ICD-10-CM | POA: Diagnosis not present

## 2016-02-25 DIAGNOSIS — R2689 Other abnormalities of gait and mobility: Secondary | ICD-10-CM | POA: Diagnosis not present

## 2016-02-25 DIAGNOSIS — S1201XG Stable burst fracture of first cervical vertebra, subsequent encounter for fracture with delayed healing: Secondary | ICD-10-CM | POA: Diagnosis not present

## 2016-02-25 DIAGNOSIS — E1165 Type 2 diabetes mellitus with hyperglycemia: Secondary | ICD-10-CM | POA: Diagnosis not present

## 2016-02-25 DIAGNOSIS — Z430 Encounter for attention to tracheostomy: Secondary | ICD-10-CM | POA: Diagnosis not present

## 2016-02-25 DIAGNOSIS — S0240FA Zygomatic fracture, left side, initial encounter for closed fracture: Secondary | ICD-10-CM | POA: Diagnosis not present

## 2016-02-25 DIAGNOSIS — S0280XA Fracture of other specified skull and facial bones, unspecified side, initial encounter for closed fracture: Secondary | ICD-10-CM | POA: Diagnosis not present

## 2016-02-25 DIAGNOSIS — S12000D Unspecified displaced fracture of first cervical vertebra, subsequent encounter for fracture with routine healing: Secondary | ICD-10-CM | POA: Diagnosis not present

## 2016-02-25 DIAGNOSIS — S22031A Stable burst fracture of third thoracic vertebra, initial encounter for closed fracture: Secondary | ICD-10-CM | POA: Diagnosis not present

## 2016-02-25 DIAGNOSIS — R739 Hyperglycemia, unspecified: Secondary | ICD-10-CM | POA: Diagnosis not present

## 2016-02-25 DIAGNOSIS — L03113 Cellulitis of right upper limb: Secondary | ICD-10-CM | POA: Diagnosis not present

## 2016-02-25 DIAGNOSIS — S0240DA Maxillary fracture, left side, initial encounter for closed fracture: Secondary | ICD-10-CM | POA: Diagnosis not present

## 2016-02-25 DIAGNOSIS — Z4682 Encounter for fitting and adjustment of non-vascular catheter: Secondary | ICD-10-CM | POA: Diagnosis not present

## 2016-02-25 DIAGNOSIS — S06819D Injury of right internal carotid artery, intracranial portion, not elsewhere classified with loss of consciousness of unspecified duration, subsequent encounter: Secondary | ICD-10-CM | POA: Diagnosis not present

## 2016-02-25 DIAGNOSIS — J387 Other diseases of larynx: Secondary | ICD-10-CM | POA: Diagnosis not present

## 2016-02-25 DIAGNOSIS — S42102S Fracture of unspecified part of scapula, left shoulder, sequela: Secondary | ICD-10-CM | POA: Diagnosis not present

## 2016-02-25 DIAGNOSIS — R1311 Dysphagia, oral phase: Secondary | ICD-10-CM | POA: Diagnosis not present

## 2016-02-25 DIAGNOSIS — Y32XXXS Crashing of motor vehicle, undetermined intent, sequela: Secondary | ICD-10-CM | POA: Diagnosis not present

## 2016-02-25 DIAGNOSIS — S42102A Fracture of unspecified part of scapula, left shoulder, initial encounter for closed fracture: Secondary | ICD-10-CM | POA: Diagnosis not present

## 2016-02-25 DIAGNOSIS — S92342D Displaced fracture of fourth metatarsal bone, left foot, subsequent encounter for fracture with routine healing: Secondary | ICD-10-CM | POA: Diagnosis not present

## 2016-02-25 DIAGNOSIS — M6281 Muscle weakness (generalized): Secondary | ICD-10-CM | POA: Diagnosis not present

## 2016-02-25 DIAGNOSIS — L0390 Cellulitis, unspecified: Secondary | ICD-10-CM | POA: Diagnosis not present

## 2016-02-25 DIAGNOSIS — S15102D Unspecified injury of left vertebral artery, subsequent encounter: Secondary | ICD-10-CM | POA: Diagnosis not present

## 2016-02-25 DIAGNOSIS — S02401A Maxillary fracture, unspecified, initial encounter for closed fracture: Secondary | ICD-10-CM | POA: Diagnosis not present

## 2016-02-25 DIAGNOSIS — S0990XD Unspecified injury of head, subsequent encounter: Secondary | ICD-10-CM | POA: Diagnosis not present

## 2016-02-25 DIAGNOSIS — S22051A Stable burst fracture of T5-T6 vertebra, initial encounter for closed fracture: Secondary | ICD-10-CM | POA: Diagnosis not present

## 2016-02-25 DIAGNOSIS — S92002A Unspecified fracture of left calcaneus, initial encounter for closed fracture: Secondary | ICD-10-CM | POA: Diagnosis not present

## 2016-02-25 DIAGNOSIS — R1310 Dysphagia, unspecified: Secondary | ICD-10-CM | POA: Diagnosis not present

## 2016-02-25 DIAGNOSIS — Z4789 Encounter for other orthopedic aftercare: Secondary | ICD-10-CM | POA: Diagnosis not present

## 2016-02-25 DIAGNOSIS — S42101S Fracture of unspecified part of scapula, right shoulder, sequela: Secondary | ICD-10-CM | POA: Diagnosis not present

## 2016-02-25 DIAGNOSIS — S15101D Unspecified injury of right vertebral artery, subsequent encounter: Secondary | ICD-10-CM | POA: Diagnosis not present

## 2016-02-25 DIAGNOSIS — S06829D Injury of left internal carotid artery, intracranial portion, not elsewhere classified with loss of consciousness of unspecified duration, subsequent encounter: Secondary | ICD-10-CM | POA: Diagnosis not present

## 2016-02-25 DIAGNOSIS — S1202XA Unstable burst fracture of first cervical vertebra, initial encounter for closed fracture: Secondary | ICD-10-CM | POA: Diagnosis not present

## 2016-02-25 DIAGNOSIS — J189 Pneumonia, unspecified organism: Secondary | ICD-10-CM | POA: Diagnosis not present

## 2016-02-25 DIAGNOSIS — S0990XS Unspecified injury of head, sequela: Secondary | ICD-10-CM | POA: Diagnosis not present

## 2016-02-25 DIAGNOSIS — S0282XA Fracture of other specified skull and facial bones, left side, initial encounter for closed fracture: Secondary | ICD-10-CM | POA: Diagnosis not present

## 2016-02-25 DIAGNOSIS — S2243XA Multiple fractures of ribs, bilateral, initial encounter for closed fracture: Secondary | ICD-10-CM | POA: Diagnosis not present

## 2016-02-25 DIAGNOSIS — R0602 Shortness of breath: Secondary | ICD-10-CM | POA: Diagnosis not present

## 2016-02-25 DIAGNOSIS — R918 Other nonspecific abnormal finding of lung field: Secondary | ICD-10-CM | POA: Diagnosis not present

## 2016-02-25 DIAGNOSIS — S22000D Wedge compression fracture of unspecified thoracic vertebra, subsequent encounter for fracture with routine healing: Secondary | ICD-10-CM | POA: Diagnosis not present

## 2016-02-25 DIAGNOSIS — Z930 Tracheostomy status: Secondary | ICD-10-CM | POA: Diagnosis not present

## 2016-02-25 DIAGNOSIS — I10 Essential (primary) hypertension: Secondary | ICD-10-CM | POA: Diagnosis not present

## 2016-02-25 DIAGNOSIS — Z9911 Dependence on respirator [ventilator] status: Secondary | ICD-10-CM | POA: Diagnosis not present

## 2016-02-25 DIAGNOSIS — J9601 Acute respiratory failure with hypoxia: Secondary | ICD-10-CM | POA: Diagnosis not present

## 2016-03-15 DIAGNOSIS — S12100D Unspecified displaced fracture of second cervical vertebra, subsequent encounter for fracture with routine healing: Secondary | ICD-10-CM | POA: Diagnosis not present

## 2016-03-15 DIAGNOSIS — S92342A Displaced fracture of fourth metatarsal bone, left foot, initial encounter for closed fracture: Secondary | ICD-10-CM | POA: Diagnosis not present

## 2016-03-15 DIAGNOSIS — Z9889 Other specified postprocedural states: Secondary | ICD-10-CM | POA: Diagnosis not present

## 2016-03-15 DIAGNOSIS — S080XXS Avulsion of scalp, sequela: Secondary | ICD-10-CM | POA: Diagnosis not present

## 2016-03-15 DIAGNOSIS — E119 Type 2 diabetes mellitus without complications: Secondary | ICD-10-CM | POA: Diagnosis not present

## 2016-03-15 DIAGNOSIS — S12000D Unspecified displaced fracture of first cervical vertebra, subsequent encounter for fracture with routine healing: Secondary | ICD-10-CM | POA: Diagnosis not present

## 2016-03-15 DIAGNOSIS — S1202XD Unstable burst fracture of first cervical vertebra, subsequent encounter for fracture with routine healing: Secondary | ICD-10-CM | POA: Diagnosis not present

## 2016-03-15 DIAGNOSIS — Z880 Allergy status to penicillin: Secondary | ICD-10-CM | POA: Diagnosis not present

## 2016-03-15 DIAGNOSIS — S22000D Wedge compression fracture of unspecified thoracic vertebra, subsequent encounter for fracture with routine healing: Secondary | ICD-10-CM | POA: Diagnosis not present

## 2016-03-15 DIAGNOSIS — R1312 Dysphagia, oropharyngeal phase: Secondary | ICD-10-CM | POA: Diagnosis not present

## 2016-03-15 DIAGNOSIS — L97429 Non-pressure chronic ulcer of left heel and midfoot with unspecified severity: Secondary | ICD-10-CM | POA: Diagnosis not present

## 2016-03-15 DIAGNOSIS — J189 Pneumonia, unspecified organism: Secondary | ICD-10-CM | POA: Diagnosis not present

## 2016-03-15 DIAGNOSIS — S22059D Unspecified fracture of T5-T6 vertebra, subsequent encounter for fracture with routine healing: Secondary | ICD-10-CM | POA: Diagnosis not present

## 2016-03-15 DIAGNOSIS — S12190D Other displaced fracture of second cervical vertebra, subsequent encounter for fracture with routine healing: Secondary | ICD-10-CM | POA: Diagnosis not present

## 2016-03-15 DIAGNOSIS — S92222A Displaced fracture of lateral cuneiform of left foot, initial encounter for closed fracture: Secondary | ICD-10-CM | POA: Diagnosis not present

## 2016-03-15 DIAGNOSIS — Y32XXXS Crashing of motor vehicle, undetermined intent, sequela: Secondary | ICD-10-CM | POA: Diagnosis not present

## 2016-03-15 DIAGNOSIS — R2689 Other abnormalities of gait and mobility: Secondary | ICD-10-CM | POA: Diagnosis not present

## 2016-03-15 DIAGNOSIS — R05 Cough: Secondary | ICD-10-CM | POA: Diagnosis not present

## 2016-03-15 DIAGNOSIS — S82121D Displaced fracture of lateral condyle of right tibia, subsequent encounter for closed fracture with routine healing: Secondary | ICD-10-CM | POA: Diagnosis not present

## 2016-03-15 DIAGNOSIS — S92252A Displaced fracture of navicular [scaphoid] of left foot, initial encounter for closed fracture: Secondary | ICD-10-CM | POA: Diagnosis not present

## 2016-03-15 DIAGNOSIS — S82141D Displaced bicondylar fracture of right tibia, subsequent encounter for closed fracture with routine healing: Secondary | ICD-10-CM | POA: Diagnosis not present

## 2016-03-15 DIAGNOSIS — J9601 Acute respiratory failure with hypoxia: Secondary | ICD-10-CM | POA: Diagnosis not present

## 2016-03-15 DIAGNOSIS — Z881 Allergy status to other antibiotic agents status: Secondary | ICD-10-CM | POA: Diagnosis not present

## 2016-03-15 DIAGNOSIS — Z7901 Long term (current) use of anticoagulants: Secondary | ICD-10-CM | POA: Diagnosis not present

## 2016-03-15 DIAGNOSIS — Z930 Tracheostomy status: Secondary | ICD-10-CM | POA: Diagnosis not present

## 2016-03-15 DIAGNOSIS — S92212A Displaced fracture of cuboid bone of left foot, initial encounter for closed fracture: Secondary | ICD-10-CM | POA: Diagnosis not present

## 2016-03-15 DIAGNOSIS — M5382 Other specified dorsopathies, cervical region: Secondary | ICD-10-CM | POA: Diagnosis not present

## 2016-03-15 DIAGNOSIS — S92902D Unspecified fracture of left foot, subsequent encounter for fracture with routine healing: Secondary | ICD-10-CM | POA: Diagnosis not present

## 2016-03-15 DIAGNOSIS — M6281 Muscle weakness (generalized): Secondary | ICD-10-CM | POA: Diagnosis not present

## 2016-03-15 DIAGNOSIS — I6521 Occlusion and stenosis of right carotid artery: Secondary | ICD-10-CM | POA: Diagnosis not present

## 2016-03-15 DIAGNOSIS — E039 Hypothyroidism, unspecified: Secondary | ICD-10-CM | POA: Diagnosis not present

## 2016-03-15 DIAGNOSIS — J069 Acute upper respiratory infection, unspecified: Secondary | ICD-10-CM | POA: Diagnosis not present

## 2016-03-15 DIAGNOSIS — S22029D Unspecified fracture of second thoracic vertebra, subsequent encounter for fracture with routine healing: Secondary | ICD-10-CM | POA: Diagnosis not present

## 2016-03-15 DIAGNOSIS — S92022A Displaced fracture of anterior process of left calcaneus, initial encounter for closed fracture: Secondary | ICD-10-CM | POA: Diagnosis not present

## 2016-03-15 DIAGNOSIS — S0990XS Unspecified injury of head, sequela: Secondary | ICD-10-CM | POA: Diagnosis not present

## 2016-03-15 DIAGNOSIS — M1711 Unilateral primary osteoarthritis, right knee: Secondary | ICD-10-CM | POA: Diagnosis not present

## 2016-03-15 DIAGNOSIS — R0989 Other specified symptoms and signs involving the circulatory and respiratory systems: Secondary | ICD-10-CM | POA: Diagnosis not present

## 2016-03-15 DIAGNOSIS — M25461 Effusion, right knee: Secondary | ICD-10-CM | POA: Diagnosis not present

## 2016-03-15 DIAGNOSIS — L0390 Cellulitis, unspecified: Secondary | ICD-10-CM | POA: Diagnosis not present

## 2016-03-15 DIAGNOSIS — L98499 Non-pressure chronic ulcer of skin of other sites with unspecified severity: Secondary | ICD-10-CM | POA: Diagnosis not present

## 2016-03-15 DIAGNOSIS — R5381 Other malaise: Secondary | ICD-10-CM | POA: Diagnosis not present

## 2016-03-15 DIAGNOSIS — M4854XA Collapsed vertebra, not elsewhere classified, thoracic region, initial encounter for fracture: Secondary | ICD-10-CM | POA: Diagnosis not present

## 2016-03-15 DIAGNOSIS — S42102S Fracture of unspecified part of scapula, left shoulder, sequela: Secondary | ICD-10-CM | POA: Diagnosis not present

## 2016-03-15 DIAGNOSIS — S42101S Fracture of unspecified part of scapula, right shoulder, sequela: Secondary | ICD-10-CM | POA: Diagnosis not present

## 2016-03-15 DIAGNOSIS — Z888 Allergy status to other drugs, medicaments and biological substances status: Secondary | ICD-10-CM | POA: Diagnosis not present

## 2016-03-15 DIAGNOSIS — Z882 Allergy status to sulfonamides status: Secondary | ICD-10-CM | POA: Diagnosis not present

## 2016-03-15 DIAGNOSIS — Z7902 Long term (current) use of antithrombotics/antiplatelets: Secondary | ICD-10-CM | POA: Diagnosis not present

## 2016-03-15 DIAGNOSIS — I10 Essential (primary) hypertension: Secondary | ICD-10-CM | POA: Diagnosis not present

## 2016-03-15 DIAGNOSIS — Z4789 Encounter for other orthopedic aftercare: Secondary | ICD-10-CM | POA: Diagnosis not present

## 2016-03-16 DIAGNOSIS — L98499 Non-pressure chronic ulcer of skin of other sites with unspecified severity: Secondary | ICD-10-CM | POA: Diagnosis not present

## 2016-03-19 DIAGNOSIS — E119 Type 2 diabetes mellitus without complications: Secondary | ICD-10-CM | POA: Diagnosis not present

## 2016-03-19 DIAGNOSIS — I10 Essential (primary) hypertension: Secondary | ICD-10-CM | POA: Diagnosis not present

## 2016-03-19 DIAGNOSIS — R1312 Dysphagia, oropharyngeal phase: Secondary | ICD-10-CM | POA: Diagnosis not present

## 2016-03-26 DIAGNOSIS — S92252A Displaced fracture of navicular [scaphoid] of left foot, initial encounter for closed fracture: Secondary | ICD-10-CM | POA: Diagnosis not present

## 2016-03-26 DIAGNOSIS — S1202XD Unstable burst fracture of first cervical vertebra, subsequent encounter for fracture with routine healing: Secondary | ICD-10-CM | POA: Diagnosis not present

## 2016-03-26 DIAGNOSIS — M1711 Unilateral primary osteoarthritis, right knee: Secondary | ICD-10-CM | POA: Diagnosis not present

## 2016-03-26 DIAGNOSIS — S12190D Other displaced fracture of second cervical vertebra, subsequent encounter for fracture with routine healing: Secondary | ICD-10-CM | POA: Diagnosis not present

## 2016-03-26 DIAGNOSIS — S12000D Unspecified displaced fracture of first cervical vertebra, subsequent encounter for fracture with routine healing: Secondary | ICD-10-CM | POA: Diagnosis not present

## 2016-03-26 DIAGNOSIS — M25461 Effusion, right knee: Secondary | ICD-10-CM | POA: Diagnosis not present

## 2016-03-26 DIAGNOSIS — S82121D Displaced fracture of lateral condyle of right tibia, subsequent encounter for closed fracture with routine healing: Secondary | ICD-10-CM | POA: Diagnosis not present

## 2016-03-26 DIAGNOSIS — M4854XA Collapsed vertebra, not elsewhere classified, thoracic region, initial encounter for fracture: Secondary | ICD-10-CM | POA: Diagnosis not present

## 2016-03-26 DIAGNOSIS — S22059D Unspecified fracture of T5-T6 vertebra, subsequent encounter for fracture with routine healing: Secondary | ICD-10-CM | POA: Diagnosis not present

## 2016-03-26 DIAGNOSIS — S92902D Unspecified fracture of left foot, subsequent encounter for fracture with routine healing: Secondary | ICD-10-CM | POA: Diagnosis not present

## 2016-03-26 DIAGNOSIS — S92022A Displaced fracture of anterior process of left calcaneus, initial encounter for closed fracture: Secondary | ICD-10-CM | POA: Diagnosis not present

## 2016-03-26 DIAGNOSIS — S92342A Displaced fracture of fourth metatarsal bone, left foot, initial encounter for closed fracture: Secondary | ICD-10-CM | POA: Diagnosis not present

## 2016-03-26 DIAGNOSIS — I6521 Occlusion and stenosis of right carotid artery: Secondary | ICD-10-CM | POA: Diagnosis not present

## 2016-03-26 DIAGNOSIS — S22029D Unspecified fracture of second thoracic vertebra, subsequent encounter for fracture with routine healing: Secondary | ICD-10-CM | POA: Diagnosis not present

## 2016-03-26 DIAGNOSIS — M5382 Other specified dorsopathies, cervical region: Secondary | ICD-10-CM | POA: Diagnosis not present

## 2016-03-26 DIAGNOSIS — S12100D Unspecified displaced fracture of second cervical vertebra, subsequent encounter for fracture with routine healing: Secondary | ICD-10-CM | POA: Diagnosis not present

## 2016-03-26 DIAGNOSIS — Z9889 Other specified postprocedural states: Secondary | ICD-10-CM | POA: Diagnosis not present

## 2016-03-26 DIAGNOSIS — S92222A Displaced fracture of lateral cuneiform of left foot, initial encounter for closed fracture: Secondary | ICD-10-CM | POA: Diagnosis not present

## 2016-03-26 DIAGNOSIS — S92212A Displaced fracture of cuboid bone of left foot, initial encounter for closed fracture: Secondary | ICD-10-CM | POA: Diagnosis not present

## 2016-03-26 DIAGNOSIS — S82141D Displaced bicondylar fracture of right tibia, subsequent encounter for closed fracture with routine healing: Secondary | ICD-10-CM | POA: Diagnosis not present

## 2016-03-31 DIAGNOSIS — Z930 Tracheostomy status: Secondary | ICD-10-CM | POA: Diagnosis not present

## 2016-03-31 DIAGNOSIS — J069 Acute upper respiratory infection, unspecified: Secondary | ICD-10-CM | POA: Diagnosis not present

## 2016-03-31 DIAGNOSIS — R0989 Other specified symptoms and signs involving the circulatory and respiratory systems: Secondary | ICD-10-CM | POA: Diagnosis not present

## 2016-04-01 DIAGNOSIS — L98499 Non-pressure chronic ulcer of skin of other sites with unspecified severity: Secondary | ICD-10-CM | POA: Diagnosis not present

## 2016-04-08 DIAGNOSIS — S0990XS Unspecified injury of head, sequela: Secondary | ICD-10-CM | POA: Diagnosis not present

## 2016-04-08 DIAGNOSIS — Z7901 Long term (current) use of anticoagulants: Secondary | ICD-10-CM | POA: Diagnosis not present

## 2016-04-08 DIAGNOSIS — L97429 Non-pressure chronic ulcer of left heel and midfoot with unspecified severity: Secondary | ICD-10-CM | POA: Diagnosis not present

## 2016-04-08 DIAGNOSIS — Z7902 Long term (current) use of antithrombotics/antiplatelets: Secondary | ICD-10-CM | POA: Diagnosis not present

## 2016-04-08 DIAGNOSIS — L98499 Non-pressure chronic ulcer of skin of other sites with unspecified severity: Secondary | ICD-10-CM | POA: Diagnosis not present

## 2016-04-09 DIAGNOSIS — E119 Type 2 diabetes mellitus without complications: Secondary | ICD-10-CM | POA: Diagnosis not present

## 2016-04-09 DIAGNOSIS — S0990XS Unspecified injury of head, sequela: Secondary | ICD-10-CM | POA: Diagnosis not present

## 2016-04-09 DIAGNOSIS — R0989 Other specified symptoms and signs involving the circulatory and respiratory systems: Secondary | ICD-10-CM | POA: Diagnosis not present

## 2016-04-09 DIAGNOSIS — Z4789 Encounter for other orthopedic aftercare: Secondary | ICD-10-CM | POA: Diagnosis not present

## 2016-04-15 DIAGNOSIS — L97429 Non-pressure chronic ulcer of left heel and midfoot with unspecified severity: Secondary | ICD-10-CM | POA: Diagnosis not present

## 2016-04-20 DIAGNOSIS — S080XXS Avulsion of scalp, sequela: Secondary | ICD-10-CM | POA: Insufficient documentation

## 2016-04-20 DIAGNOSIS — M4854XA Collapsed vertebra, not elsewhere classified, thoracic region, initial encounter for fracture: Secondary | ICD-10-CM | POA: Diagnosis not present

## 2016-04-20 DIAGNOSIS — Z9889 Other specified postprocedural states: Secondary | ICD-10-CM | POA: Diagnosis not present

## 2016-04-22 DIAGNOSIS — Z4789 Encounter for other orthopedic aftercare: Secondary | ICD-10-CM | POA: Diagnosis not present

## 2016-04-22 DIAGNOSIS — Z930 Tracheostomy status: Secondary | ICD-10-CM | POA: Diagnosis not present

## 2016-04-22 DIAGNOSIS — S0990XS Unspecified injury of head, sequela: Secondary | ICD-10-CM | POA: Diagnosis not present

## 2016-04-22 DIAGNOSIS — L97429 Non-pressure chronic ulcer of left heel and midfoot with unspecified severity: Secondary | ICD-10-CM | POA: Diagnosis not present

## 2016-04-29 DIAGNOSIS — L98499 Non-pressure chronic ulcer of skin of other sites with unspecified severity: Secondary | ICD-10-CM | POA: Diagnosis not present

## 2016-05-06 DIAGNOSIS — L98499 Non-pressure chronic ulcer of skin of other sites with unspecified severity: Secondary | ICD-10-CM | POA: Diagnosis not present

## 2016-05-10 DIAGNOSIS — R05 Cough: Secondary | ICD-10-CM | POA: Diagnosis not present

## 2016-05-10 DIAGNOSIS — R0989 Other specified symptoms and signs involving the circulatory and respiratory systems: Secondary | ICD-10-CM | POA: Diagnosis not present

## 2016-05-10 DIAGNOSIS — S0990XS Unspecified injury of head, sequela: Secondary | ICD-10-CM | POA: Diagnosis not present

## 2016-05-10 DIAGNOSIS — Z930 Tracheostomy status: Secondary | ICD-10-CM | POA: Diagnosis not present

## 2016-05-11 DIAGNOSIS — R0989 Other specified symptoms and signs involving the circulatory and respiratory systems: Secondary | ICD-10-CM | POA: Diagnosis not present

## 2016-05-11 DIAGNOSIS — R5381 Other malaise: Secondary | ICD-10-CM | POA: Diagnosis not present

## 2016-05-11 DIAGNOSIS — J189 Pneumonia, unspecified organism: Secondary | ICD-10-CM | POA: Diagnosis not present

## 2016-05-11 DIAGNOSIS — R05 Cough: Secondary | ICD-10-CM | POA: Diagnosis not present

## 2016-05-12 DIAGNOSIS — Z882 Allergy status to sulfonamides status: Secondary | ICD-10-CM | POA: Diagnosis not present

## 2016-05-12 DIAGNOSIS — Z888 Allergy status to other drugs, medicaments and biological substances status: Secondary | ICD-10-CM | POA: Diagnosis not present

## 2016-05-12 DIAGNOSIS — S080XXS Avulsion of scalp, sequela: Secondary | ICD-10-CM | POA: Diagnosis not present

## 2016-05-12 DIAGNOSIS — Z880 Allergy status to penicillin: Secondary | ICD-10-CM | POA: Diagnosis not present

## 2016-05-12 DIAGNOSIS — Z881 Allergy status to other antibiotic agents status: Secondary | ICD-10-CM | POA: Diagnosis not present

## 2016-05-12 DIAGNOSIS — Z930 Tracheostomy status: Secondary | ICD-10-CM | POA: Diagnosis not present

## 2016-05-15 ENCOUNTER — Emergency Department (HOSPITAL_COMMUNITY)
Admission: EM | Admit: 2016-05-15 | Discharge: 2016-05-15 | Disposition: A | Discharge status: Home / Self Care | Payer: Medicare Other | Attending: Emergency Medicine | Admitting: Emergency Medicine

## 2016-05-15 ENCOUNTER — Encounter (HOSPITAL_COMMUNITY): Payer: Self-pay | Admitting: Emergency Medicine

## 2016-05-15 ENCOUNTER — Emergency Department (HOSPITAL_COMMUNITY): Payer: Medicare Other

## 2016-05-15 DIAGNOSIS — Z79899 Other long term (current) drug therapy: Secondary | ICD-10-CM | POA: Diagnosis not present

## 2016-05-15 DIAGNOSIS — Y929 Unspecified place or not applicable: Secondary | ICD-10-CM | POA: Diagnosis not present

## 2016-05-15 DIAGNOSIS — Y939 Activity, unspecified: Secondary | ICD-10-CM | POA: Insufficient documentation

## 2016-05-15 DIAGNOSIS — Z85828 Personal history of other malignant neoplasm of skin: Secondary | ICD-10-CM | POA: Insufficient documentation

## 2016-05-15 DIAGNOSIS — S0992XA Unspecified injury of nose, initial encounter: Secondary | ICD-10-CM | POA: Diagnosis present

## 2016-05-15 DIAGNOSIS — S022XXA Fracture of nasal bones, initial encounter for closed fracture: Secondary | ICD-10-CM | POA: Insufficient documentation

## 2016-05-15 DIAGNOSIS — E039 Hypothyroidism, unspecified: Secondary | ICD-10-CM | POA: Insufficient documentation

## 2016-05-15 DIAGNOSIS — W0110XA Fall on same level from slipping, tripping and stumbling with subsequent striking against unspecified object, initial encounter: Secondary | ICD-10-CM | POA: Diagnosis not present

## 2016-05-15 DIAGNOSIS — Y999 Unspecified external cause status: Secondary | ICD-10-CM | POA: Insufficient documentation

## 2016-05-15 DIAGNOSIS — W19XXXA Unspecified fall, initial encounter: Secondary | ICD-10-CM

## 2016-05-15 LAB — CBG MONITORING, ED: GLUCOSE-CAPILLARY: 109 mg/dL — AB (ref 65–99)
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
## 2016-05-19 ENCOUNTER — Ambulatory Visit (INDEPENDENT_AMBULATORY_CARE_PROVIDER_SITE_OTHER): Payer: Medicare Other | Admitting: Family Medicine

## 2016-05-19 ENCOUNTER — Encounter: Payer: Self-pay | Admitting: Family Medicine

## 2016-05-19 VITALS — BP 114/78 | HR 83 | Temp 96.9°F | Ht 66.0 in | Wt 164.6 lb

## 2016-05-19 DIAGNOSIS — F411 Generalized anxiety disorder: Secondary | ICD-10-CM | POA: Diagnosis not present

## 2016-05-19 DIAGNOSIS — E039 Hypothyroidism, unspecified: Secondary | ICD-10-CM | POA: Diagnosis not present

## 2016-05-19 DIAGNOSIS — E119 Type 2 diabetes mellitus without complications: Secondary | ICD-10-CM

## 2016-05-19 DIAGNOSIS — R002 Palpitations: Secondary | ICD-10-CM | POA: Diagnosis not present

## 2016-05-19 LAB — GLUCOSE HEMOCUE WAIVED: GLU HEMOCUE WAIVED: 98 mg/dL (ref 65–99)
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
## 2016-05-20 ENCOUNTER — Ambulatory Visit (HOSPITAL_COMMUNITY)
Admission: EM | Admit: 2016-05-20 | Discharge: 2016-05-20 | Disposition: A | Discharge status: Home / Self Care | Payer: Medicare Other | Attending: Emergency Medicine | Admitting: Emergency Medicine

## 2016-05-20 ENCOUNTER — Encounter (HOSPITAL_COMMUNITY): Payer: Self-pay | Admitting: Emergency Medicine

## 2016-05-20 DIAGNOSIS — S90822A Blister (nonthermal), left foot, initial encounter: Secondary | ICD-10-CM | POA: Diagnosis not present

## 2016-05-20 MED ORDER — SILVER SULFADIAZINE 1 % EX CREA: 1.0000 "application " | TOPICAL_CREAM | Freq: Every day | CUTANEOUS | 0 refills | Status: DC

## 2016-05-20 NOTE — Discharge Instructions (Addendum)
Your foot has been bandaged here in clinic. I have prescribed silvadene cream. Apply to your foot daily, watch for signs and symptoms of infection. Should it become infected or fail to heal, follow up with your primary care provider or return to clinic as needed.

## 2016-05-20 NOTE — ED Notes (Signed)
Washed blister, covered w/2x3 telfa and secured w/3 in coban.

## 2016-05-20 NOTE — ED Triage Notes (Signed)
Pt had orthopedic surgery on her left foot in October.  She was seen by her PCP yesterday.  Today, she noticed some blisters on her foot.  It is not causing her any discomfort.
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
## 2016-05-27 ENCOUNTER — Ambulatory Visit: Payer: Medicare Other | Admitting: Family Medicine

## 2016-05-31 ENCOUNTER — Other Ambulatory Visit: Payer: Medicare Other

## 2016-05-31 DIAGNOSIS — R7309 Other abnormal glucose: Secondary | ICD-10-CM

## 2016-05-31 LAB — BAYER DCA HB A1C WAIVED: HB A1C: 5 % (ref <7.0)

## 2016-06-01 LAB — CMP14+EGFR
A/G RATIO: 1.6 (ref 1.2–2.2)
ALK PHOS: 116 IU/L (ref 39–117)
ALT: 13 IU/L (ref 0–32)
AST: 16 IU/L (ref 0–40)
Albumin: 4.4 g/dL (ref 3.6–4.8)
BILIRUBIN TOTAL: 0.5 mg/dL (ref 0.0–1.2)
BUN/Creatinine Ratio: 14 (ref 12–28)
BUN: 8 mg/dL (ref 8–27)
CHLORIDE: 94 mmol/L — AB (ref 96–106)
CO2: 24 mmol/L (ref 18–29)
Calcium: 9.5 mg/dL (ref 8.7–10.3)
Creatinine, Ser: 0.57 mg/dL (ref 0.57–1.00)
GFR calc non Af Amer: 98 mL/min/{1.73_m2} (ref >59)
GFR, EST AFRICAN AMERICAN: 113 mL/min/{1.73_m2} (ref >59)
GLUCOSE: 114 mg/dL — AB (ref 65–99)
Globulin, Total: 2.8 g/dL (ref 1.5–4.5)
POTASSIUM: 4 mmol/L (ref 3.5–5.2)
Sodium: 141 mmol/L (ref 134–144)
TOTAL PROTEIN: 7.2 g/dL (ref 6.0–8.5)

## 2016-06-01 LAB — CBC WITH DIFFERENTIAL/PLATELET
BASOS ABS: 0.1 10*3/uL (ref 0.0–0.2)
BASOS: 1 %
EOS (ABSOLUTE): 0.3 10*3/uL (ref 0.0–0.4)
Eos: 2 %
Hematocrit: 41.3 % (ref 34.0–46.6)
Hemoglobin: 13.1 g/dL (ref 11.1–15.9)
IMMATURE GRANS (ABS): 0.1 10*3/uL (ref 0.0–0.1)
Immature Granulocytes: 1 %
LYMPHS ABS: 2.3 10*3/uL (ref 0.7–3.1)
Lymphs: 18 %
MCH: 28.6 pg (ref 26.6–33.0)
MCHC: 31.7 g/dL (ref 31.5–35.7)
MCV: 90 fL (ref 79–97)
MONOS ABS: 1 10*3/uL — AB (ref 0.1–0.9)
Monocytes: 8 %
Neutrophils Absolute: 8.8 10*3/uL — ABNORMAL HIGH (ref 1.4–7.0)
Neutrophils: 70 %
PLATELETS: 332 10*3/uL (ref 150–379)
RBC: 4.58 x10E6/uL (ref 3.77–5.28)
RDW: 17.1 % — AB (ref 12.3–15.4)
WBC: 12.5 10*3/uL — ABNORMAL HIGH (ref 3.4–10.8)

## 2016-06-02 ENCOUNTER — Ambulatory Visit (INDEPENDENT_AMBULATORY_CARE_PROVIDER_SITE_OTHER): Payer: Medicare Other | Admitting: Family Medicine

## 2016-06-02 ENCOUNTER — Encounter: Payer: Self-pay | Admitting: Family Medicine

## 2016-06-02 VITALS — BP 114/77 | HR 95 | Temp 97.7°F | Ht 66.0 in | Wt 161.0 lb

## 2016-06-02 DIAGNOSIS — R002 Palpitations: Secondary | ICD-10-CM | POA: Diagnosis not present

## 2016-06-02 DIAGNOSIS — R609 Edema, unspecified: Secondary | ICD-10-CM | POA: Diagnosis not present

## 2016-06-02 DIAGNOSIS — E039 Hypothyroidism, unspecified: Secondary | ICD-10-CM

## 2016-06-02 DIAGNOSIS — F411 Generalized anxiety disorder: Secondary | ICD-10-CM

## 2016-06-02 MED ORDER — ALPRAZOLAM 1 MG PO TABS: ORAL_TABLET | ORAL | 0 refills | Status: DC

## 2016-06-02 MED ORDER — DULOXETINE HCL 60 MG PO CPEP: 60.0000 mg | ORAL_CAPSULE | Freq: Every day | ORAL | 1 refills | Status: DC

## 2016-06-02 MED ORDER — SILVER SULFADIAZINE 1 % EX CREA: 1.0000 "application " | TOPICAL_CREAM | Freq: Every day | CUTANEOUS | 1 refills | Status: DC
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
## 2016-06-05 ENCOUNTER — Ambulatory Visit: Payer: Medicare Other

## 2016-06-07 ENCOUNTER — Other Ambulatory Visit: Payer: Self-pay | Admitting: Family Medicine

## 2016-06-08 ENCOUNTER — Encounter: Payer: Self-pay | Admitting: Family

## 2016-06-08 ENCOUNTER — Ambulatory Visit: Payer: Medicare Other | Admitting: Family Medicine

## 2016-06-08 ENCOUNTER — Ambulatory Visit (INDEPENDENT_AMBULATORY_CARE_PROVIDER_SITE_OTHER): Payer: Medicare Other | Admitting: Family

## 2016-06-08 VITALS — BP 125/72 | HR 86 | Temp 97.0°F | Ht 66.0 in | Wt 164.8 lb

## 2016-06-08 DIAGNOSIS — R609 Edema, unspecified: Secondary | ICD-10-CM

## 2016-06-08 DIAGNOSIS — S90822D Blister (nonthermal), left foot, subsequent encounter: Secondary | ICD-10-CM
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
## 2016-06-08 NOTE — Patient Instructions (Signed)

## 2016-06-14 ENCOUNTER — Other Ambulatory Visit: Payer: Self-pay | Admitting: *Deleted

## 2016-06-14 MED ORDER — CLOPIDOGREL BISULFATE 75 MG PO TABS: 75.0000 mg | ORAL_TABLET | Freq: Every day | ORAL | 1 refills | Status: DC

## 2016-06-23 ENCOUNTER — Ambulatory Visit: Payer: Medicare Other | Admitting: Family Medicine

## 2016-06-25 ENCOUNTER — Encounter: Payer: Self-pay | Admitting: Family Medicine

## 2016-06-25 ENCOUNTER — Ambulatory Visit (INDEPENDENT_AMBULATORY_CARE_PROVIDER_SITE_OTHER): Payer: Medicare Other | Admitting: Family Medicine

## 2016-06-25 VITALS — BP 110/69 | HR 97 | Temp 98.2°F | Ht 66.0 in | Wt 166.0 lb

## 2016-06-25 DIAGNOSIS — R002 Palpitations: Secondary | ICD-10-CM | POA: Diagnosis not present

## 2016-06-25 DIAGNOSIS — F411 Generalized anxiety disorder: Secondary | ICD-10-CM

## 2016-06-25 MED ORDER — FUROSEMIDE 40 MG PO TABS: 40.0000 mg | ORAL_TABLET | Freq: Every day | ORAL | 5 refills | Status: DC

## 2016-06-25 MED ORDER — METOPROLOL TARTRATE 50 MG PO TABS: 25.0000 mg | ORAL_TABLET | Freq: Every day | ORAL | 5 refills | Status: DC

## 2016-06-25 MED ORDER — LEVOTHYROXINE SODIUM 112 MCG PO TABS: 112.0000 ug | ORAL_TABLET | Freq: Every day | ORAL | 1 refills | Status: DC

## 2016-06-25 MED ORDER — HYDROCODONE-ACETAMINOPHEN 5-325 MG PO TABS: 1.0000 | ORAL_TABLET | Freq: Two times a day (BID) | ORAL | 0 refills | Status: DC | PRN

## 2016-06-25 MED ORDER — CLOPIDOGREL BISULFATE 75 MG PO TABS: 75.0000 mg | ORAL_TABLET | Freq: Every day | ORAL | 5 refills | Status: DC

## 2016-06-25 MED ORDER — ATENOLOL 50 MG PO TABS: ORAL_TABLET | ORAL | 1 refills | Status: DC

## 2016-06-25 MED ORDER — DULOXETINE HCL 60 MG PO CPEP: 60.0000 mg | ORAL_CAPSULE | Freq: Every day | ORAL | 1 refills | Status: DC

## 2016-06-25 MED ORDER — ATORVASTATIN CALCIUM 20 MG PO TABS: 20.0000 mg | ORAL_TABLET | Freq: Every day | ORAL | 5 refills | Status: DC

## 2016-06-25 MED ORDER — ALPRAZOLAM 2 MG PO TABS: ORAL_TABLET | ORAL | 1 refills | Status: DC
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
## 2016-06-29 ENCOUNTER — Telehealth: Payer: Self-pay | Admitting: Family Medicine

## 2016-06-29 ENCOUNTER — Encounter: Payer: Self-pay | Admitting: *Deleted

## 2016-06-29 DIAGNOSIS — Z7409 Other reduced mobility: Secondary | ICD-10-CM

## 2016-06-29 DIAGNOSIS — S0990XA Unspecified injury of head, initial encounter: Secondary | ICD-10-CM | POA: Insufficient documentation

## 2016-06-29 DIAGNOSIS — R531 Weakness: Secondary | ICD-10-CM | POA: Insufficient documentation

## 2016-06-29 DIAGNOSIS — R6889 Other general symptoms and signs: Secondary | ICD-10-CM | POA: Insufficient documentation

## 2016-06-29 NOTE — Telephone Encounter (Signed)
Patient called with questions regarding medication. Informed patient that she no longer needs to take atenolol and to take metoprolol instead for palpitations per Dr. Sanjuan Dame note

## 2016-07-07 ENCOUNTER — Encounter: Payer: Self-pay | Admitting: Family Medicine

## 2016-07-07 ENCOUNTER — Ambulatory Visit (INDEPENDENT_AMBULATORY_CARE_PROVIDER_SITE_OTHER): Payer: Medicare Other | Admitting: Family Medicine

## 2016-07-07 VITALS — BP 139/80 | HR 92 | Temp 98.0°F | Ht 66.0 in | Wt 166.0 lb

## 2016-07-07 DIAGNOSIS — M7521 Bicipital tendinitis, right shoulder: Secondary | ICD-10-CM | POA: Diagnosis not present

## 2016-07-07 DIAGNOSIS — L03116 Cellulitis of left lower limb: Secondary | ICD-10-CM | POA: Diagnosis not present

## 2016-07-07 MED ORDER — PREDNISONE 20 MG PO TABS: ORAL_TABLET | ORAL | 0 refills | Status: DC

## 2016-07-07 MED ORDER — DOXYCYCLINE HYCLATE 100 MG PO TABS: 100.0000 mg | ORAL_TABLET | Freq: Two times a day (BID) | ORAL | 0 refills | Status: DC
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
## 2016-07-08 ENCOUNTER — Other Ambulatory Visit: Payer: Self-pay | Admitting: *Deleted

## 2016-07-08 MED ORDER — METOPROLOL TARTRATE 25 MG PO TABS: 25.0000 mg | ORAL_TABLET | Freq: Two times a day (BID) | ORAL | 0 refills | Status: DC

## 2016-07-09 ENCOUNTER — Ambulatory Visit (INDEPENDENT_AMBULATORY_CARE_PROVIDER_SITE_OTHER): Payer: Medicare Other | Admitting: Family Medicine

## 2016-07-09 DIAGNOSIS — S22000D Wedge compression fracture of unspecified thoracic vertebra, subsequent encounter for fracture with routine healing: Secondary | ICD-10-CM | POA: Diagnosis not present

## 2016-07-09 DIAGNOSIS — I10 Essential (primary) hypertension: Secondary | ICD-10-CM

## 2016-07-09 DIAGNOSIS — L03116 Cellulitis of left lower limb: Secondary | ICD-10-CM | POA: Diagnosis not present

## 2016-07-09 DIAGNOSIS — S0181XD Laceration without foreign body of other part of head, subsequent encounter: Secondary | ICD-10-CM | POA: Diagnosis not present

## 2016-07-09 DIAGNOSIS — S42101D Fracture of unspecified part of scapula, right shoulder, subsequent encounter for fracture with routine healing: Secondary | ICD-10-CM

## 2016-07-09 DIAGNOSIS — E119 Type 2 diabetes mellitus without complications: Secondary | ICD-10-CM

## 2016-07-09 DIAGNOSIS — S12000D Unspecified displaced fracture of first cervical vertebra, subsequent encounter for fracture with routine healing: Secondary | ICD-10-CM | POA: Diagnosis not present

## 2016-07-09 DIAGNOSIS — S42102D Fracture of unspecified part of scapula, left shoulder, subsequent encounter for fracture with routine healing: Secondary | ICD-10-CM

## 2016-07-16 ENCOUNTER — Ambulatory Visit: Payer: Medicare Other | Admitting: Pediatrics

## 2016-07-19 ENCOUNTER — Encounter: Payer: Self-pay | Admitting: Family Medicine

## 2016-07-21 ENCOUNTER — Encounter: Payer: Self-pay | Admitting: Family Medicine

## 2016-07-21 ENCOUNTER — Ambulatory Visit (INDEPENDENT_AMBULATORY_CARE_PROVIDER_SITE_OTHER): Payer: Medicare Other | Admitting: Family Medicine

## 2016-07-21 VITALS — BP 118/72 | HR 78 | Temp 98.1°F | Ht 66.0 in | Wt 170.0 lb

## 2016-07-21 DIAGNOSIS — S90822D Blister (nonthermal), left foot, subsequent encounter: Secondary | ICD-10-CM

## 2016-07-21 DIAGNOSIS — E039 Hypothyroidism, unspecified: Secondary | ICD-10-CM | POA: Diagnosis not present

## 2016-07-21 DIAGNOSIS — R609 Edema, unspecified: Secondary | ICD-10-CM

## 2016-07-21 DIAGNOSIS — I10 Essential (primary) hypertension: Secondary | ICD-10-CM

## 2016-07-21 MED ORDER — FUROSEMIDE 40 MG PO TABS: 40.0000 mg | ORAL_TABLET | Freq: Every day | ORAL | 3 refills | Status: DC

## 2016-07-21 MED ORDER — LEVOTHYROXINE SODIUM 112 MCG PO TABS: 112.0000 ug | ORAL_TABLET | Freq: Every day | ORAL | 1 refills | Status: DC

## 2016-07-21 MED ORDER — METOPROLOL TARTRATE 25 MG PO TABS: 25.0000 mg | ORAL_TABLET | Freq: Two times a day (BID) | ORAL | 3 refills | Status: DC

## 2016-07-21 MED ORDER — HYDROCODONE-ACETAMINOPHEN 5-325 MG PO TABS: 1.0000 | ORAL_TABLET | Freq: Two times a day (BID) | ORAL | 0 refills | Status: DC | PRN
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
## 2016-08-03 ENCOUNTER — Other Ambulatory Visit: Payer: Self-pay | Admitting: Family Medicine

## 2016-08-04 ENCOUNTER — Other Ambulatory Visit: Payer: Self-pay | Admitting: Family Medicine

## 2016-08-04 ENCOUNTER — Ambulatory Visit: Payer: Medicare Other | Admitting: Family Medicine

## 2016-08-04 ENCOUNTER — Ambulatory Visit (INDEPENDENT_AMBULATORY_CARE_PROVIDER_SITE_OTHER): Payer: Medicare Other | Admitting: Family Medicine

## 2016-08-04 ENCOUNTER — Encounter: Payer: Self-pay | Admitting: Family Medicine

## 2016-08-04 VITALS — BP 127/80 | HR 74 | Temp 99.7°F | Ht 66.0 in | Wt 177.8 lb

## 2016-08-04 DIAGNOSIS — S91302D Unspecified open wound, left foot, subsequent encounter: Secondary | ICD-10-CM | POA: Diagnosis not present

## 2016-08-04 DIAGNOSIS — T148XXA Other injury of unspecified body region, initial encounter: Secondary | ICD-10-CM

## 2016-08-04 LAB — CBC WITH DIFFERENTIAL/PLATELET
BASOS: 1 %
Basophils Absolute: 0.1 10*3/uL (ref 0.0–0.2)
EOS (ABSOLUTE): 0.5 10*3/uL — AB (ref 0.0–0.4)
Eos: 5 %
HEMATOCRIT: 36.1 % (ref 34.0–46.6)
Hemoglobin: 11.9 g/dL (ref 11.1–15.9)
IMMATURE GRANULOCYTES: 1 %
Immature Grans (Abs): 0.1 10*3/uL (ref 0.0–0.1)
LYMPHS ABS: 2.2 10*3/uL (ref 0.7–3.1)
Lymphs: 22 %
MCH: 29.2 pg (ref 26.6–33.0)
MCHC: 33 g/dL (ref 31.5–35.7)
MCV: 89 fL (ref 79–97)
MONOS ABS: 0.8 10*3/uL (ref 0.1–0.9)
Monocytes: 8 %
NEUTROS PCT: 63 %
Neutrophils Absolute: 6.7 10*3/uL (ref 1.4–7.0)
PLATELETS: 250 10*3/uL (ref 150–379)
RBC: 4.07 x10E6/uL (ref 3.77–5.28)
RDW: 15.4 % (ref 12.3–15.4)
WBC: 10.4 10*3/uL (ref 3.4–10.8)

## 2016-08-04 MED ORDER — DOXYCYCLINE HYCLATE 100 MG PO TABS: 100.0000 mg | ORAL_TABLET | Freq: Two times a day (BID) | ORAL | 0 refills | Status: DC

## 2016-08-04 NOTE — Patient Instructions (Signed)
Great to see you!  Be sure to finish all antibiotics, be sure to call right away to get an appointment with Dr. Irving Shows.
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
## 2016-08-04 NOTE — Telephone Encounter (Signed)
Last filled 07/01/16. Seen today. She has been to pharmacy twice today

## 2016-08-11 ENCOUNTER — Telehealth: Payer: Self-pay | Admitting: Family Medicine
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
## 2016-08-11 NOTE — Telephone Encounter (Signed)
Advised patient Dr. Warrick Parisian cannot write another prescription for her pain medication.  Scheduled her for a follow up appointment with Dr. Warrick Parisian for next week to discuss medications.

## 2016-08-11 NOTE — Telephone Encounter (Signed)
Unfortunately no week cannot do that, she will need to come back in her usual time and we'll just have to her pain meds out between now and then

## 2016-08-19 ENCOUNTER — Ambulatory Visit (INDEPENDENT_AMBULATORY_CARE_PROVIDER_SITE_OTHER): Payer: Medicare Other | Admitting: Family Medicine

## 2016-08-19 ENCOUNTER — Encounter: Payer: Self-pay | Admitting: Family Medicine

## 2016-08-19 DIAGNOSIS — R600 Localized edema: Secondary | ICD-10-CM | POA: Diagnosis not present

## 2016-08-19 MED ORDER — CLOPIDOGREL BISULFATE 75 MG PO TABS: 75.0000 mg | ORAL_TABLET | Freq: Every day | ORAL | 5 refills | Status: DC

## 2016-08-19 MED ORDER — HYDROCODONE-ACETAMINOPHEN 5-325 MG PO TABS: 1.0000 | ORAL_TABLET | Freq: Two times a day (BID) | ORAL | 0 refills | Status: DC | PRN
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
## 2016-08-28 ENCOUNTER — Other Ambulatory Visit: Payer: Self-pay | Admitting: Family Medicine

## 2016-08-31 ENCOUNTER — Telehealth: Payer: Self-pay | Admitting: Family Medicine

## 2016-09-01 ENCOUNTER — Other Ambulatory Visit: Payer: Self-pay

## 2016-09-01 DIAGNOSIS — S0990XD Unspecified injury of head, subsequent encounter: Secondary | ICD-10-CM

## 2016-09-01 NOTE — Telephone Encounter (Signed)
We can call in 1 month refill, then she will have to be seen before any other refills. Warned her for future that we will have to either stop the benzos or the narcotics because of the interaction. We will discuss that at next visit. Caryl Pina, MD [facility] 09/01/2016, 8:01 AM

## 2016-09-01 NOTE — Progress Notes (Unsigned)
spe

## 2016-09-16 ENCOUNTER — Encounter: Payer: Self-pay | Admitting: Physician Assistant

## 2016-09-16 ENCOUNTER — Ambulatory Visit (INDEPENDENT_AMBULATORY_CARE_PROVIDER_SITE_OTHER): Payer: Medicare Other | Admitting: Physician Assistant

## 2016-09-16 VITALS — BP 124/68 | HR 99 | Temp 98.7°F | Ht 66.0 in | Wt 177.0 lb

## 2016-09-16 DIAGNOSIS — J0110 Acute frontal sinusitis, unspecified: Secondary | ICD-10-CM | POA: Diagnosis not present

## 2016-09-16 MED ORDER — DOXYCYCLINE HYCLATE 100 MG PO TABS: 100.0000 mg | ORAL_TABLET | Freq: Two times a day (BID) | ORAL | 0 refills | Status: DC

## 2016-09-16 NOTE — Progress Notes (Signed)
Subjective:     Patient ID: Heather Leblanc, female   DOB: 06/20/1950, 66 y.o.   MRN: [id number]  HPI Pt with several day hx of progressive sinus pressure/pain Sx worse to the L forehead area Long hx of allergic rhinitis and recurrent sinusitis Prev pt of Dr Velora Heckler Prev on antihist and nasal steroids Currently not using either meds  Review of Systems  Constitutional: Negative for activity change, appetite change, fatigue and fever.  HENT: Positive for congestion, postnasal drip, sinus pain, sinus pressure and sneezing. Negative for rhinorrhea and sore throat.   Respiratory: Negative.   Cardiovascular: Negative.        Objective:   Physical Exam  Constitutional: She appears well-developed and well-nourished.  HENT:  Right Ear: External ear normal.  Left Ear: External ear normal.  Mouth/Throat: Oropharynx is clear and moist. No oropharyngeal exudate.  +TTP of the L frontal sinus  Neck: Neck supple.  Cardiovascular: Normal rate, regular rhythm and normal heart sounds.   Pulmonary/Chest: Effort normal and breath sounds normal.  Lymphadenopathy:    She has no cervical adenopathy.  Nursing note and vitals reviewed.      Assessment:     Acute frontal sinusitis, recurrence not specified      Plan:     Doxycycline has worked well for her the past so rx done today Restart nasal steroids which are now OTC Restart OTC antihistamines Hydrate well  Rest F/U prn

## 2016-09-16 NOTE — Patient Instructions (Signed)

## 2016-10-11 ENCOUNTER — Other Ambulatory Visit: Payer: Self-pay | Admitting: Family Medicine

## 2016-10-12 ENCOUNTER — Telehealth: Payer: Self-pay | Admitting: Family Medicine

## 2016-10-12 MED ORDER — HYDROCODONE-ACETAMINOPHEN 5-325 MG PO TABS: 1.0000 | ORAL_TABLET | Freq: Two times a day (BID) | ORAL | 0 refills | Status: DC | PRN

## 2016-10-12 NOTE — Telephone Encounter (Signed)
I will give one month and pt needs to follow up with PCP.

## 2016-10-12 NOTE — Telephone Encounter (Signed)
rx printed and ready - can not reach pt - no VM and no answer

## 2016-10-15 NOTE — Telephone Encounter (Signed)
Patient came and picked up

## 2016-11-05 IMAGING — MG MM DIGITAL SCREENING
5 series · 5 of 5 positions shown · non-contrast
Comparison: Previous exam(s).

CLINICAL DATA: Screening.

EXAM:
DIGITAL SCREENING BILATERAL MAMMOGRAM WITH CAD

[L CC]
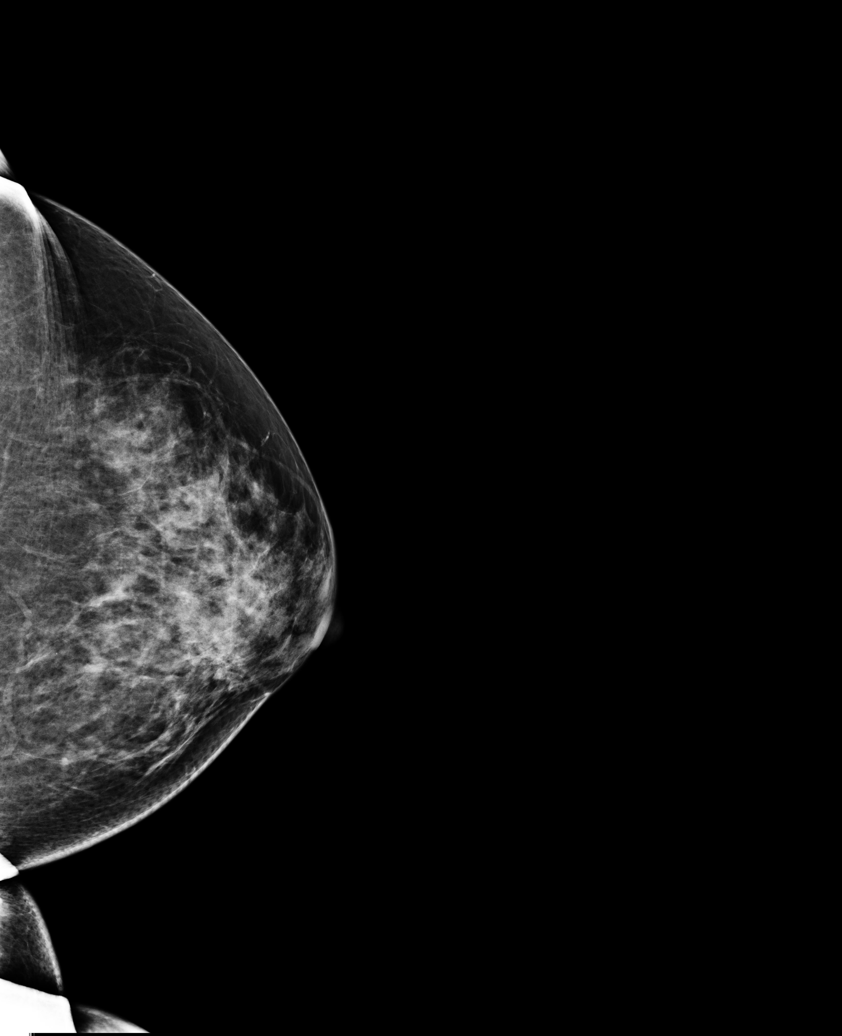

[L MLO]
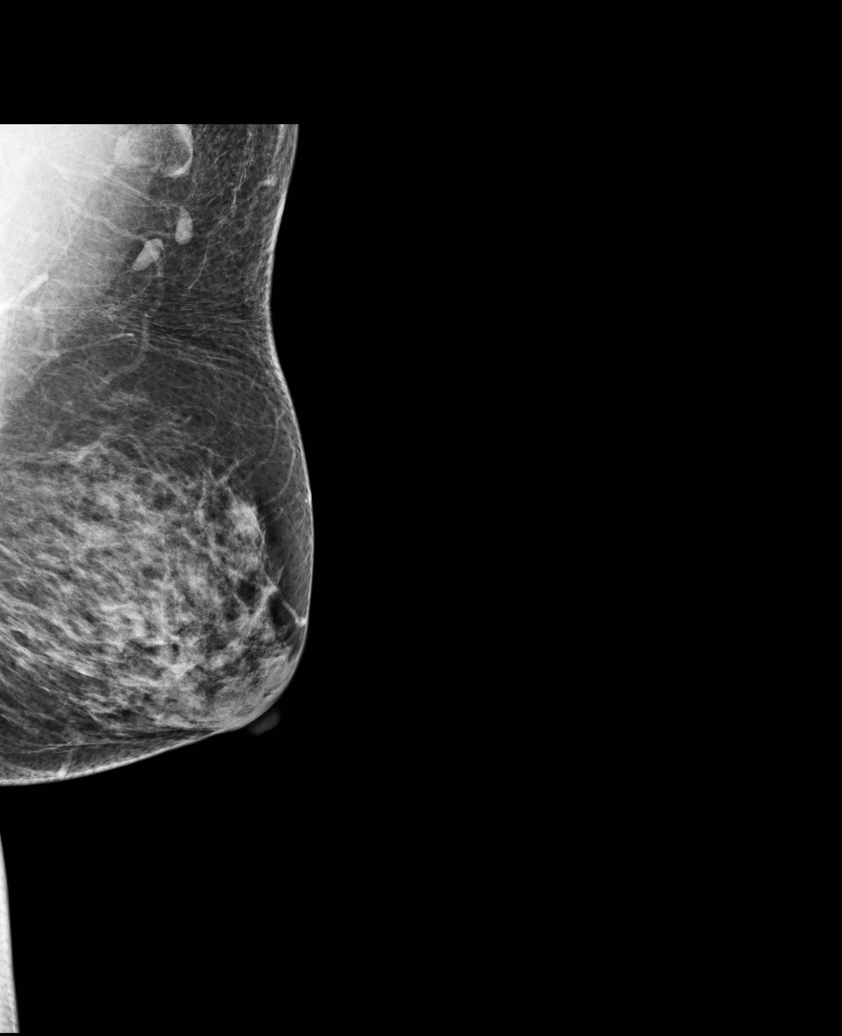

[R CC (1 of 2)]
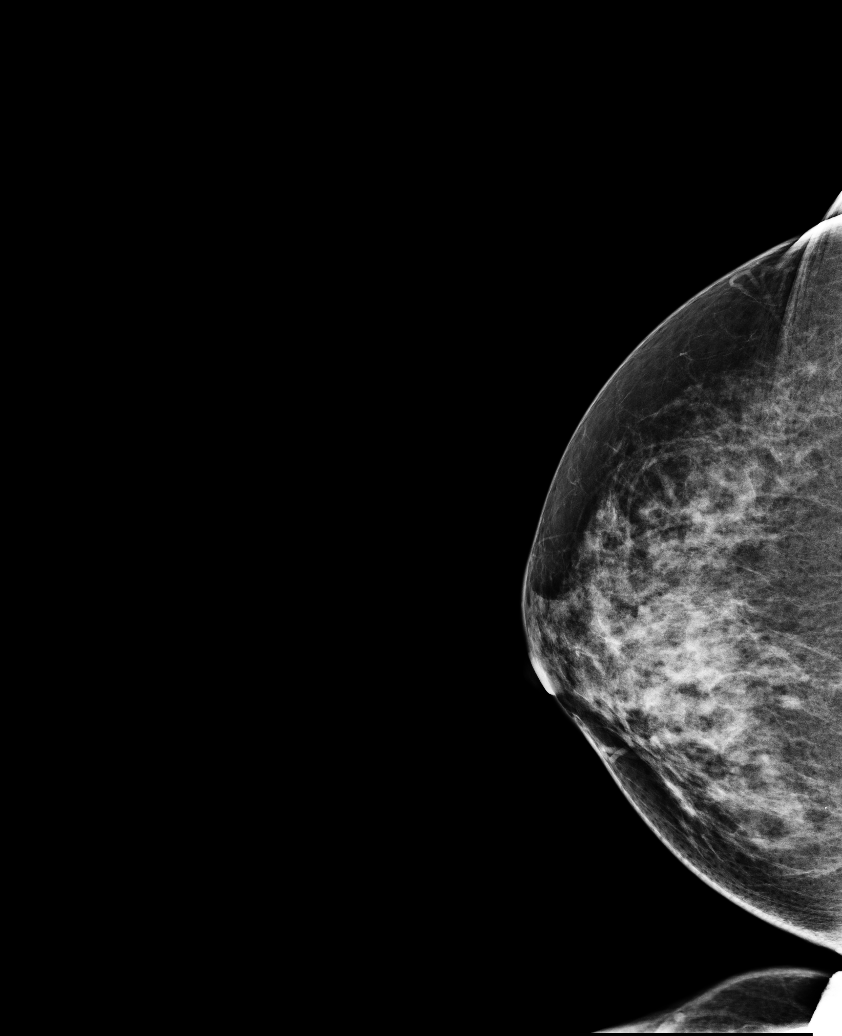

[R MLO]
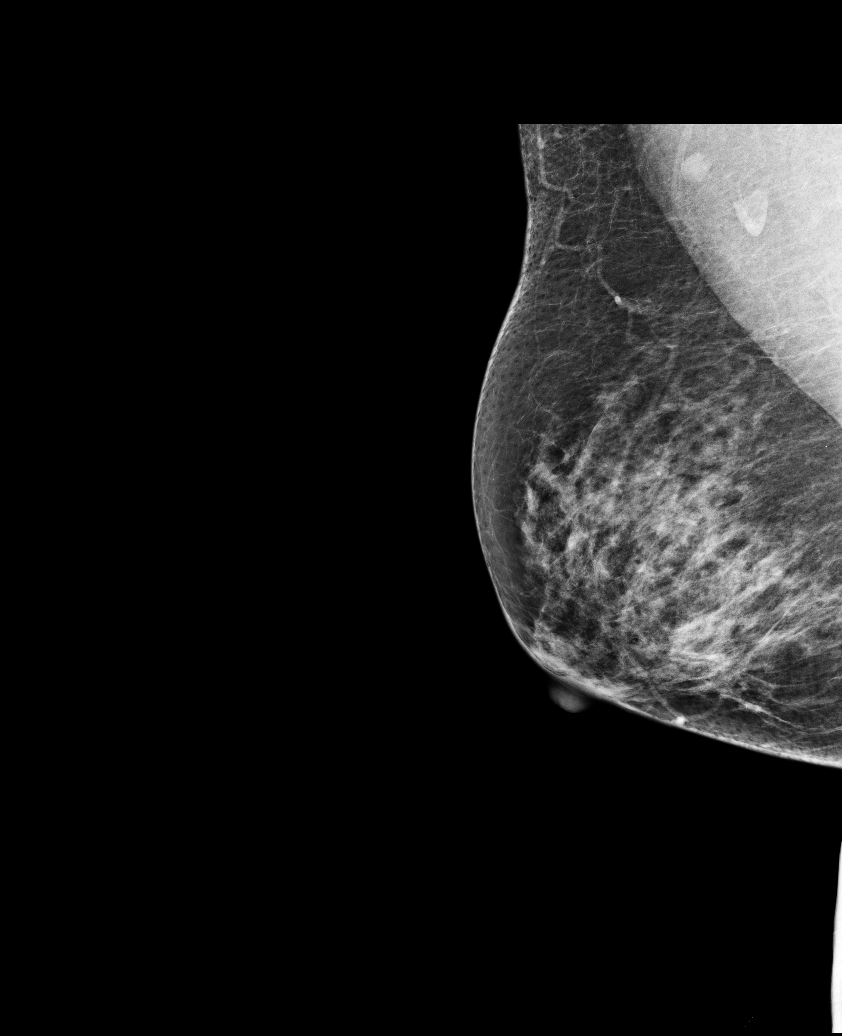

[R CC (2 of 2)]
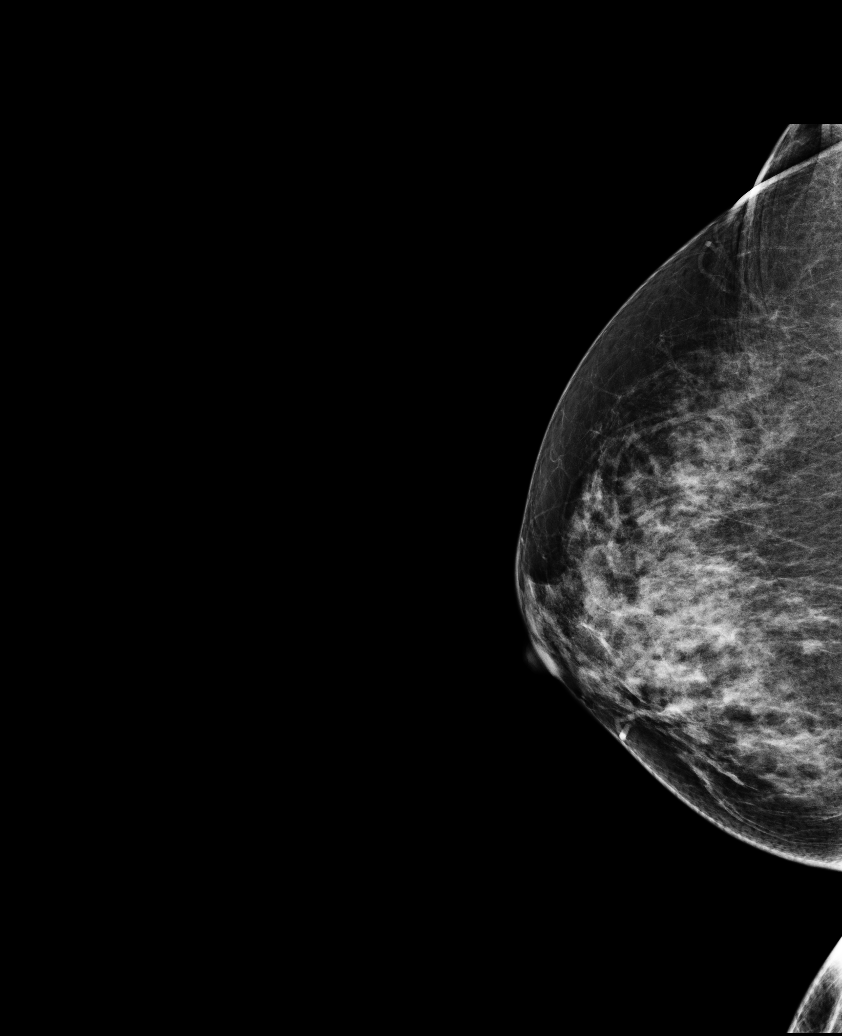

[5 of 5 positions shown; findings below may reference images not displayed]

ACR Breast Density Category c: The breast tissue is heterogeneously
dense, which may obscure small masses.
FINDINGS: There are no findings suspicious for malignancy. Images were
processed with CAD.
IMPRESSION: No mammographic evidence of malignancy. A result letter of this
screening mammogram will be mailed directly to the patient.

RECOMMENDATION:
Screening mammogram in one year. (Code:YJ-2-FEZ)

BI-RADS CATEGORY  1: Negative.

## 2016-11-08 ENCOUNTER — Ambulatory Visit (INDEPENDENT_AMBULATORY_CARE_PROVIDER_SITE_OTHER): Payer: Medicare Other | Admitting: Family Medicine

## 2016-11-08 ENCOUNTER — Encounter: Payer: Self-pay | Admitting: Family Medicine

## 2016-11-08 VITALS — BP 129/83 | HR 85 | Temp 98.5°F | Ht 66.0 in | Wt 176.0 lb

## 2016-11-08 DIAGNOSIS — M62838 Other muscle spasm: Secondary | ICD-10-CM | POA: Diagnosis not present

## 2016-11-08 DIAGNOSIS — M542 Cervicalgia: Secondary | ICD-10-CM | POA: Diagnosis not present

## 2016-11-08 MED ORDER — BACLOFEN 10 MG PO TABS: 10.0000 mg | ORAL_TABLET | Freq: Three times a day (TID) | ORAL | 0 refills | Status: DC

## 2016-11-08 MED ORDER — HYDROCODONE-ACETAMINOPHEN 5-325 MG PO TABS: 1.0000 | ORAL_TABLET | Freq: Two times a day (BID) | ORAL | 0 refills | Status: DC | PRN
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
## 2016-11-27 ENCOUNTER — Other Ambulatory Visit: Payer: Self-pay | Admitting: Family Medicine

## 2016-11-27 DIAGNOSIS — I10 Essential (primary) hypertension: Secondary | ICD-10-CM

## 2016-11-29 NOTE — Telephone Encounter (Signed)
Phoned in.

## 2016-11-29 NOTE — Telephone Encounter (Signed)
Dettinger last seen 7/30- please address refill on xanax - route to nurse for phone in

## 2016-11-29 NOTE — Telephone Encounter (Signed)
Go ahead and call in the refill.

## 2016-12-09 ENCOUNTER — Ambulatory Visit (INDEPENDENT_AMBULATORY_CARE_PROVIDER_SITE_OTHER): Payer: Medicare Other | Admitting: Family Medicine

## 2016-12-09 ENCOUNTER — Encounter: Payer: Self-pay | Admitting: Family Medicine

## 2016-12-09 VITALS — BP 135/80 | HR 86 | Temp 98.0°F | Ht 66.0 in | Wt 177.0 lb

## 2016-12-09 DIAGNOSIS — Z13220 Encounter for screening for lipoid disorders: Secondary | ICD-10-CM | POA: Diagnosis not present

## 2016-12-09 DIAGNOSIS — R7309 Other abnormal glucose: Secondary | ICD-10-CM

## 2016-12-09 DIAGNOSIS — F411 Generalized anxiety disorder: Secondary | ICD-10-CM | POA: Diagnosis not present

## 2016-12-09 DIAGNOSIS — I10 Essential (primary) hypertension: Secondary | ICD-10-CM

## 2016-12-09 DIAGNOSIS — E039 Hypothyroidism, unspecified: Secondary | ICD-10-CM

## 2016-12-09 NOTE — Progress Notes (Signed)
BP 135/80   Pulse 86   Temp 98 F (36.7 C) (Oral)   Ht '5\' 6"'$  (1.676 m)   Wt 177 lb (80.3 kg)   BMI 28.57 kg/m    Subjective:    Patient ID: Heather Leblanc, female    DOB: October 14, 1950, 66 y.o.   MRN: [id number]  HPI: Heather Leblanc is a 66 y.o. female presenting on 12/09/2016 for Hypothyroidism (follow up; patient is fasting) and Hypertension   HPI Hypothyroidism recheck Patient is coming in for thyroid recheck today as well. They deny any issues with hair changes or heat or cold problems or diarrhea or constipation. They deny any chest pain or palpitations. They are currently on levothyroxine 112 micrograms   Hypertension Patient is currently on metoprolol, and their blood pressure today is 135/80. Patient denies any lightheadedness or dizziness. Patient denies headaches, blurred vision, chest pains, shortness of breath, or weakness. Denies any side effects from medication and is content with current medication.   Anxiety recheck Patient is currently on Cymbalta for anxiety and says she's doing pre-well on it. She did run out of the Cymbalta for a period time and that may be why she is a little bit down on the medication. We will check her thyroid levels because it has been some time relatively she's doing very well on the anxiety.  Relevant past medical, surgical, family and social history reviewed and updated as indicated. Interim medical history since our last visit reviewed. Allergies and medications reviewed and updated.  Review of Systems  Constitutional: Positive for fatigue. Negative for chills and fever.  Eyes: Negative for redness and visual disturbance.  Respiratory: Negative for chest tightness and shortness of breath.   Cardiovascular: Negative for chest pain and leg swelling.  Genitourinary: Negative for difficulty urinating and dysuria.  Musculoskeletal: Negative for back pain and gait problem.  Skin: Negative for rash.  Neurological: Negative for dizziness,  light-headedness and headaches.  Psychiatric/Behavioral: Positive for dysphoric mood. Negative for agitation, behavioral problems, decreased concentration, self-injury, sleep disturbance and suicidal ideas. The patient is nervous/anxious.   All other systems reviewed and are negative.   Per HPI unless specifically indicated above     Objective:    BP 135/80   Pulse 86   Temp 98 F (36.7 C) (Oral)   Ht '5\' 6"'$  (1.676 m)   Wt 177 lb (80.3 kg)   BMI 28.57 kg/m   Wt Readings from Last 3 Encounters:  12/09/16 177 lb (80.3 kg)  11/08/16 176 lb (79.8 kg)  09/16/16 177 lb (80.3 kg)    Physical Exam  Constitutional: She is oriented to person, place, and time. She appears well-developed and well-nourished. No distress.  Eyes: Conjunctivae are normal.  Neck: Neck supple. No thyromegaly present.  Cardiovascular: Normal rate, regular rhythm, normal heart sounds and intact distal pulses.   No murmur heard. Pulmonary/Chest: Effort normal and breath sounds normal. No respiratory distress. She has no wheezes. She has no rales.  Musculoskeletal: Normal range of motion.  Lymphadenopathy:    She has no cervical adenopathy.  Neurological: She is alert and oriented to person, place, and time. Coordination normal.  Skin: Skin is warm and dry. No rash noted. She is not diaphoretic.  Psychiatric: She has a normal mood and affect. Her behavior is normal. Thought content normal. Her speech is tangential. She expresses inappropriate judgment. She expresses no suicidal ideation. She expresses no suicidal plans.  Nursing note and vitals reviewed.     Assessment &  Plan:   Problem List Items Addressed This Visit      Cardiovascular and Mediastinum   Hypertension   Relevant Orders   CMP14+EGFR     Endocrine   Hypothyroidism - Primary   Relevant Orders   TSH     Other   GAD (generalized anxiety disorder)    Other Visit Diagnoses    Lipid screening       Relevant Orders   Lipid panel        Follow up plan: Return in about 6 months (around 06/09/2017), or if symptoms worsen or fail to improve, for Hypertension and thyroid recheck.  Counseling provided for all of the vaccine components Orders Placed This Encounter  Procedures  . TSH  . CMP14+EGFR  . Lipid panel    Caryl Pina, MD [facility] 12/09/2016, 3:36 PM

## 2016-12-10 ENCOUNTER — Other Ambulatory Visit: Payer: Medicare Other

## 2016-12-10 DIAGNOSIS — E039 Hypothyroidism, unspecified: Secondary | ICD-10-CM

## 2016-12-10 DIAGNOSIS — Z13220 Encounter for screening for lipoid disorders: Secondary | ICD-10-CM

## 2016-12-10 DIAGNOSIS — F411 Generalized anxiety disorder: Secondary | ICD-10-CM

## 2016-12-10 DIAGNOSIS — R7309 Other abnormal glucose: Secondary | ICD-10-CM

## 2016-12-10 DIAGNOSIS — I10 Essential (primary) hypertension: Secondary | ICD-10-CM

## 2016-12-11 LAB — TSH: TSH: 57.51 u[IU]/mL — AB (ref 0.450–4.500)

## 2016-12-11 LAB — CMP14+EGFR
A/G RATIO: 1.5 (ref 1.2–2.2)
ALK PHOS: 163 IU/L — AB (ref 39–117)
ALT: 53 IU/L — AB (ref 0–32)
AST: 44 IU/L — AB (ref 0–40)
Albumin: 4.4 g/dL (ref 3.6–4.8)
BILIRUBIN TOTAL: 0.6 mg/dL (ref 0.0–1.2)
BUN/Creatinine Ratio: 14 (ref 12–28)
BUN: 12 mg/dL (ref 8–27)
CHLORIDE: 95 mmol/L — AB (ref 96–106)
CO2: 28 mmol/L (ref 20–29)
Calcium: 9.4 mg/dL (ref 8.7–10.3)
Creatinine, Ser: 0.83 mg/dL (ref 0.57–1.00)
GFR calc non Af Amer: 74 mL/min/{1.73_m2} (ref >59)
GFR, EST AFRICAN AMERICAN: 85 mL/min/{1.73_m2} (ref >59)
Globulin, Total: 2.9 g/dL (ref 1.5–4.5)
Glucose: 150 mg/dL — ABNORMAL HIGH (ref 65–99)
POTASSIUM: 4.1 mmol/L (ref 3.5–5.2)
Sodium: 139 mmol/L (ref 134–144)
Total Protein: 7.3 g/dL (ref 6.0–8.5)

## 2016-12-11 LAB — LIPID PANEL
CHOLESTEROL TOTAL: 233 mg/dL — AB (ref 100–199)
Chol/HDL Ratio: 6.1 ratio — ABNORMAL HIGH (ref 0.0–4.4)
HDL: 38 mg/dL — AB (ref >39)
LDL Calculated: 151 mg/dL — ABNORMAL HIGH (ref 0–99)
TRIGLYCERIDES: 222 mg/dL — AB (ref 0–149)
VLDL Cholesterol Cal: 44 mg/dL — ABNORMAL HIGH (ref 5–40)

## 2016-12-14 ENCOUNTER — Other Ambulatory Visit: Payer: Self-pay | Admitting: *Deleted

## 2016-12-14 DIAGNOSIS — R748 Abnormal levels of other serum enzymes: Secondary | ICD-10-CM

## 2016-12-15 ENCOUNTER — Other Ambulatory Visit: Payer: Self-pay

## 2016-12-15 LAB — BAYER DCA HB A1C WAIVED: HB A1C: 6.8 % (ref <7.0)

## 2016-12-15 MED ORDER — METFORMIN HCL 500 MG PO TABS: 500.0000 mg | ORAL_TABLET | Freq: Two times a day (BID) | ORAL | 1 refills | Status: DC

## 2016-12-15 NOTE — Addendum Note (Signed)
Addended by: Michaela Corner on: 12/15/2016 11:16 AM   Modules accepted: Orders

## 2017-01-03 ENCOUNTER — Other Ambulatory Visit: Payer: Self-pay | Admitting: Family Medicine

## 2017-01-03 DIAGNOSIS — M62838 Other muscle spasm: Secondary | ICD-10-CM

## 2017-01-03 DIAGNOSIS — M542 Cervicalgia: Secondary | ICD-10-CM

## 2017-01-04 NOTE — Telephone Encounter (Signed)
Go ahead and call in alprazolam and send in baclofen. We can print out this prescription for hydrocodone but I recommend that this be her last as she should not be mixing hydrocodone and alprazolam.  Caryl Pina, MD [facility] 01/04/2017, 9:50 PM

## 2017-01-14 ENCOUNTER — Other Ambulatory Visit: Payer: Medicare Other

## 2017-01-14 DIAGNOSIS — R748 Abnormal levels of other serum enzymes: Secondary | ICD-10-CM

## 2017-01-15 LAB — HEPATIC FUNCTION PANEL
ALK PHOS: 119 IU/L — AB (ref 39–117)
ALT: 24 IU/L (ref 0–32)
AST: 24 IU/L (ref 0–40)
Albumin: 4.4 g/dL (ref 3.6–4.8)
BILIRUBIN TOTAL: 1 mg/dL (ref 0.0–1.2)
Bilirubin, Direct: 0.26 mg/dL (ref 0.00–0.40)
Total Protein: 7.2 g/dL (ref 6.0–8.5)

## 2017-01-18 ENCOUNTER — Telehealth: Payer: Self-pay | Admitting: Family Medicine

## 2017-01-19 NOTE — Telephone Encounter (Signed)
Pt aware that MVI should be fine  Shingles shot was discussed.

## 2017-01-21 ENCOUNTER — Ambulatory Visit: Payer: Medicare Other | Admitting: Family Medicine

## 2017-01-25 ENCOUNTER — Ambulatory Visit: Payer: Medicare Other | Admitting: Family Medicine

## 2017-01-27 ENCOUNTER — Telehealth: Payer: Self-pay | Admitting: Family Medicine

## 2017-01-27 NOTE — Telephone Encounter (Signed)
Pt given appt with nurse tomorrow morning for shingrix.

## 2017-01-28 ENCOUNTER — Ambulatory Visit (INDEPENDENT_AMBULATORY_CARE_PROVIDER_SITE_OTHER): Payer: Medicare Other | Admitting: *Deleted

## 2017-01-28 DIAGNOSIS — Z23 Encounter for immunization: Secondary | ICD-10-CM

## 2017-01-28 NOTE — Progress Notes (Signed)
Pt given Shingrix vaccine Tolerated well 

## 2017-02-02 ENCOUNTER — Other Ambulatory Visit: Payer: Self-pay | Admitting: Family Medicine

## 2017-02-02 DIAGNOSIS — M542 Cervicalgia: Secondary | ICD-10-CM

## 2017-02-02 DIAGNOSIS — M62838 Other muscle spasm: Secondary | ICD-10-CM

## 2017-02-03 ENCOUNTER — Telehealth: Payer: Self-pay | Admitting: Family Medicine

## 2017-02-03 ENCOUNTER — Other Ambulatory Visit: Payer: Self-pay

## 2017-02-03 DIAGNOSIS — I10 Essential (primary) hypertension: Secondary | ICD-10-CM

## 2017-02-03 DIAGNOSIS — M542 Cervicalgia: Secondary | ICD-10-CM

## 2017-02-03 DIAGNOSIS — M62838 Other muscle spasm: Secondary | ICD-10-CM

## 2017-02-03 DIAGNOSIS — E039 Hypothyroidism, unspecified: Secondary | ICD-10-CM

## 2017-02-04 MED ORDER — DULOXETINE HCL 60 MG PO CPEP: 60.0000 mg | ORAL_CAPSULE | Freq: Every day | ORAL | 1 refills | Status: DC

## 2017-02-04 MED ORDER — BACLOFEN 10 MG PO TABS: ORAL_TABLET | ORAL | 0 refills | Status: DC

## 2017-02-04 MED ORDER — ALPRAZOLAM 2 MG PO TABS: ORAL_TABLET | ORAL | 0 refills | Status: DC

## 2017-02-04 MED ORDER — ATORVASTATIN CALCIUM 20 MG PO TABS: 20.0000 mg | ORAL_TABLET | Freq: Every day | ORAL | 0 refills | Status: DC

## 2017-02-04 MED ORDER — LEVOTHYROXINE SODIUM 112 MCG PO TABS: 112.0000 ug | ORAL_TABLET | Freq: Every day | ORAL | 1 refills | Status: DC

## 2017-02-04 MED ORDER — HYDROCODONE-ACETAMINOPHEN 5-325 MG PO TABS: ORAL_TABLET | ORAL | 0 refills | Status: DC

## 2017-02-04 MED ORDER — METOPROLOL TARTRATE 25 MG PO TABS: ORAL_TABLET | ORAL | 0 refills | Status: DC

## 2017-02-04 MED ORDER — METFORMIN HCL 500 MG PO TABS: 500.0000 mg | ORAL_TABLET | Freq: Two times a day (BID) | ORAL | 0 refills | Status: DC

## 2017-02-04 NOTE — Telephone Encounter (Signed)
Go ahead and do 1 month refill of each but let her know that this is the last month she will be getting the hydrocodone because of the interaction between the 2 so she should space it out and taper herself down and start using other things for pain

## 2017-02-04 NOTE — Telephone Encounter (Signed)
Patient aware that 1 month refills have been sent to pharmacy and she will need to pick up a couple of paper prescriptions here at this office.  Patient also aware that she will need to taper herself down on the hyrocodone.

## 2017-02-25 ENCOUNTER — Other Ambulatory Visit: Payer: Self-pay | Admitting: Family Medicine

## 2017-02-25 DIAGNOSIS — M62838 Other muscle spasm: Secondary | ICD-10-CM

## 2017-03-04 ENCOUNTER — Other Ambulatory Visit: Payer: Self-pay | Admitting: Family Medicine

## 2017-03-04 DIAGNOSIS — M542 Cervicalgia: Secondary | ICD-10-CM

## 2017-03-04 NOTE — Telephone Encounter (Signed)
Will have to wait for dr. Warrick Parisian- [facility] for pain meds

## 2017-03-07 ENCOUNTER — Ambulatory Visit: Payer: Medicare Other | Admitting: Pediatrics

## 2017-03-07 ENCOUNTER — Other Ambulatory Visit: Payer: Self-pay | Admitting: *Deleted

## 2017-03-08 ENCOUNTER — Encounter: Payer: Self-pay | Admitting: Family Medicine

## 2017-03-14 ENCOUNTER — Ambulatory Visit: Payer: Medicare Other | Admitting: Family Medicine

## 2017-03-14 NOTE — Telephone Encounter (Signed)
Patient needs an appointment

## 2017-03-15 ENCOUNTER — Telehealth: Payer: Self-pay | Admitting: Family Medicine

## 2017-03-15 ENCOUNTER — Encounter: Payer: Self-pay | Admitting: Family Medicine

## 2017-03-15 DIAGNOSIS — M542 Cervicalgia: Secondary | ICD-10-CM

## 2017-03-15 MED ORDER — HYDROCODONE-ACETAMINOPHEN 5-325 MG PO TABS: ORAL_TABLET | ORAL | 0 refills | Status: AC

## 2017-03-15 MED ORDER — ALPRAZOLAM 2 MG PO TABS: ORAL_TABLET | ORAL | 2 refills | Status: AC

## 2017-03-15 NOTE — Telephone Encounter (Signed)
Go ahead and give her 3 months of each and that should give her enough time to find a new provider.  In future practices she should always be aware that no-show policies are in place in most places

## 2017-03-15 NOTE — Telephone Encounter (Signed)
I spoke with Heather Leblanc about her dismissal from the practice.  She is obviously very upset about this.  She said she was out of her medicines and would need refills.  Xanax Hydrocodone  Please send to [facility].

## 2017-03-15 NOTE — Telephone Encounter (Signed)
Prescription placed at front desk for pick up per Dr. Warrick Parisian, patient aware

## 2017-03-17 ENCOUNTER — Ambulatory Visit: Payer: Medicare Other | Admitting: Family Medicine

## 2017-03-17 ENCOUNTER — Other Ambulatory Visit: Payer: Self-pay

## 2017-03-17 DIAGNOSIS — I10 Essential (primary) hypertension: Secondary | ICD-10-CM

## 2017-03-17 DIAGNOSIS — R609 Edema, unspecified: Secondary | ICD-10-CM

## 2017-03-17 DIAGNOSIS — M62838 Other muscle spasm: Secondary | ICD-10-CM

## 2017-03-17 MED ORDER — ATORVASTATIN CALCIUM 20 MG PO TABS: 20.0000 mg | ORAL_TABLET | Freq: Every day | ORAL | 0 refills | Status: AC

## 2017-03-17 MED ORDER — METOPROLOL TARTRATE 25 MG PO TABS: ORAL_TABLET | ORAL | 0 refills | Status: DC

## 2017-03-17 MED ORDER — TRIAMCINOLONE ACETONIDE 0.1 % EX CREA: TOPICAL_CREAM | Freq: Two times a day (BID) | CUTANEOUS | 0 refills | Status: AC

## 2017-03-17 MED ORDER — DULOXETINE HCL 60 MG PO CPEP: 60.0000 mg | ORAL_CAPSULE | Freq: Every day | ORAL | 0 refills | Status: AC

## 2017-03-17 MED ORDER — BACLOFEN 10 MG PO TABS: 10.0000 mg | ORAL_TABLET | Freq: Three times a day (TID) | ORAL | 0 refills | Status: AC

## 2017-03-17 MED ORDER — FUROSEMIDE 40 MG PO TABS: 40.0000 mg | ORAL_TABLET | Freq: Every day | ORAL | 0 refills | Status: AC

## 2017-03-17 MED ORDER — METFORMIN HCL 500 MG PO TABS: 500.0000 mg | ORAL_TABLET | Freq: Two times a day (BID) | ORAL | 0 refills | Status: AC

## 2017-03-17 MED ORDER — LEVOTHYROXINE SODIUM 125 MCG PO TABS: 125.0000 ug | ORAL_TABLET | Freq: Every day | ORAL | 0 refills | Status: AC

## 2017-04-18 ENCOUNTER — Ambulatory Visit (INDEPENDENT_AMBULATORY_CARE_PROVIDER_SITE_OTHER): Payer: Medicare Other | Admitting: *Deleted

## 2017-04-18 DIAGNOSIS — Z23 Encounter for immunization: Secondary | ICD-10-CM

## 2017-04-19 NOTE — Progress Notes (Signed)
Pt given Shingrix vaccine Tolerated well 

## 2017-04-27 ENCOUNTER — Other Ambulatory Visit: Payer: Self-pay | Admitting: Family Medicine

## 2017-04-27 DIAGNOSIS — I10 Essential (primary) hypertension: Secondary | ICD-10-CM

## 2017-05-27 ENCOUNTER — Ambulatory Visit: Payer: Medicare Other | Admitting: Family Medicine

## 2017-06-02 ENCOUNTER — Other Ambulatory Visit: Payer: Self-pay | Admitting: Family Medicine

## 2017-06-02 DIAGNOSIS — M62838 Other muscle spasm: Secondary | ICD-10-CM

## 2017-06-02 DIAGNOSIS — I10 Essential (primary) hypertension: Secondary | ICD-10-CM

## 2017-06-03 NOTE — Telephone Encounter (Signed)
Last seen 12/09/16

## 2017-06-03 NOTE — Telephone Encounter (Signed)
Was not patient discharged from our practice, Caryl Pina, MD [facility] 06/03/2017, 8:18 AM

## 2017-09-20 ENCOUNTER — Ambulatory Visit: Payer: Medicare Other | Admitting: Registered"

## 2017-10-10 ENCOUNTER — Ambulatory Visit: Payer: Medicare Other | Admitting: Registered"

## 2018-01-30 ENCOUNTER — Other Ambulatory Visit: Payer: Self-pay | Admitting: Family Medicine

## 2018-01-30 DIAGNOSIS — R229 Localized swelling, mass and lump, unspecified: Secondary | ICD-10-CM

## 2018-01-31 ENCOUNTER — Other Ambulatory Visit: Payer: Medicare Other

## 2018-02-01 ENCOUNTER — Other Ambulatory Visit: Payer: Medicare Other

## 2018-02-03 ENCOUNTER — Ambulatory Visit
Admission: RE | Admit: 2018-02-03 | Discharge: 2018-02-03 | Disposition: A | Discharge status: Home / Self Care | Payer: Medicare Other | Source: Ambulatory Visit | Attending: Family Medicine | Admitting: Family Medicine

## 2018-02-03 DIAGNOSIS — R229 Localized swelling, mass and lump, unspecified: Secondary | ICD-10-CM

## 2018-05-13 DEATH — deceased
